# Patient Record
Sex: Female | Born: 1935 | Race: White | Hispanic: No
Health system: Southern US, Community
[De-identification: ages and names within clinical notes are randomized; demographics above are authoritative.]

## PROBLEM LIST (undated history)

## (undated) DIAGNOSIS — B019 Varicella without complication: Secondary | ICD-10-CM

## (undated) DIAGNOSIS — E785 Hyperlipidemia, unspecified: Secondary | ICD-10-CM

## (undated) DIAGNOSIS — N390 Urinary tract infection, site not specified: Secondary | ICD-10-CM

## (undated) DIAGNOSIS — J189 Pneumonia, unspecified organism: Secondary | ICD-10-CM

## (undated) DIAGNOSIS — I509 Heart failure, unspecified: Secondary | ICD-10-CM

## (undated) DIAGNOSIS — K219 Gastro-esophageal reflux disease without esophagitis: Secondary | ICD-10-CM

## (undated) DIAGNOSIS — R011 Cardiac murmur, unspecified: Secondary | ICD-10-CM

## (undated) DIAGNOSIS — M1990 Unspecified osteoarthritis, unspecified site: Secondary | ICD-10-CM

## (undated) DIAGNOSIS — E860 Dehydration: Secondary | ICD-10-CM

## (undated) HISTORY — DX: Unspecified osteoarthritis, unspecified site: M19.90

## (undated) HISTORY — DX: Pneumonia, unspecified organism: J18.9

## (undated) HISTORY — DX: Heart failure, unspecified: I50.9

## (undated) HISTORY — PX: ABDOMINAL HYSTERECTOMY: SHX81

## (undated) HISTORY — DX: Gastro-esophageal reflux disease without esophagitis: K21.9

## (undated) HISTORY — PX: APPENDECTOMY: SHX54

## (undated) HISTORY — PX: OTHER SURGICAL HISTORY: SHX169

## (undated) HISTORY — PX: BREAST BIOPSY: SHX20

## (undated) HISTORY — PX: TONSILLECTOMY AND ADENOIDECTOMY: SHX28

## (undated) HISTORY — DX: Varicella without complication: B01.9

## (undated) HISTORY — DX: Hyperlipidemia, unspecified: E78.5

## (undated) HISTORY — PX: SHOULDER ARTHROSCOPY: SHX128

## (undated) HISTORY — DX: Cardiac murmur, unspecified: R01.1

---

## 1979-07-11 HISTORY — PX: CHOLECYSTECTOMY: SHX55

## 2000-02-11 ENCOUNTER — Encounter: Admission: RE | Admit: 2000-02-11 | Discharge: 2000-02-11 | Payer: Self-pay | Admitting: Obstetrics and Gynecology

## 2000-02-11 ENCOUNTER — Encounter: Payer: Self-pay | Admitting: Obstetrics and Gynecology

## 2001-02-15 ENCOUNTER — Encounter: Payer: Self-pay | Admitting: Obstetrics and Gynecology

## 2001-02-15 ENCOUNTER — Encounter: Admission: RE | Admit: 2001-02-15 | Discharge: 2001-02-15 | Payer: Self-pay | Admitting: Obstetrics and Gynecology

## 2002-02-15 ENCOUNTER — Encounter: Payer: Self-pay | Admitting: Obstetrics and Gynecology

## 2002-02-15 ENCOUNTER — Encounter: Admission: RE | Admit: 2002-02-15 | Discharge: 2002-02-15 | Payer: Self-pay | Admitting: Obstetrics and Gynecology

## 2002-03-29 ENCOUNTER — Encounter (INDEPENDENT_AMBULATORY_CARE_PROVIDER_SITE_OTHER): Payer: Self-pay | Admitting: Specialist

## 2002-03-29 ENCOUNTER — Ambulatory Visit (HOSPITAL_COMMUNITY): Admission: RE | Admit: 2002-03-29 | Discharge: 2002-03-29 | Payer: Self-pay | Admitting: Gastroenterology

## 2002-11-09 DIAGNOSIS — J189 Pneumonia, unspecified organism: Secondary | ICD-10-CM

## 2002-11-09 HISTORY — DX: Pneumonia, unspecified organism: J18.9

## 2003-01-24 ENCOUNTER — Inpatient Hospital Stay (HOSPITAL_COMMUNITY): Admission: EM | Admit: 2003-01-24 | Discharge: 2003-01-29 | Payer: Self-pay

## 2003-01-26 ENCOUNTER — Encounter: Payer: Self-pay | Admitting: Internal Medicine

## 2003-01-27 ENCOUNTER — Encounter: Payer: Self-pay | Admitting: Internal Medicine

## 2003-02-28 ENCOUNTER — Encounter: Admission: RE | Admit: 2003-02-28 | Discharge: 2003-02-28 | Payer: Self-pay | Admitting: Obstetrics and Gynecology

## 2003-02-28 ENCOUNTER — Encounter: Payer: Self-pay | Admitting: Obstetrics and Gynecology

## 2003-08-16 ENCOUNTER — Encounter: Payer: Self-pay | Admitting: Obstetrics and Gynecology

## 2003-08-16 ENCOUNTER — Encounter: Admission: RE | Admit: 2003-08-16 | Discharge: 2003-08-16 | Payer: Self-pay | Admitting: Obstetrics and Gynecology

## 2004-02-29 ENCOUNTER — Encounter: Admission: RE | Admit: 2004-02-29 | Discharge: 2004-02-29 | Payer: Self-pay | Admitting: Obstetrics and Gynecology

## 2005-03-02 ENCOUNTER — Encounter: Admission: RE | Admit: 2005-03-02 | Discharge: 2005-03-02 | Payer: Self-pay | Admitting: Internal Medicine

## 2006-03-04 ENCOUNTER — Encounter: Admission: RE | Admit: 2006-03-04 | Discharge: 2006-03-04 | Payer: Self-pay | Admitting: Obstetrics and Gynecology

## 2006-07-05 ENCOUNTER — Encounter: Admission: RE | Admit: 2006-07-05 | Discharge: 2006-07-05 | Payer: Self-pay | Admitting: Internal Medicine

## 2007-03-08 ENCOUNTER — Encounter: Admission: RE | Admit: 2007-03-08 | Discharge: 2007-03-08 | Payer: Self-pay | Admitting: Obstetrics and Gynecology

## 2007-03-10 ENCOUNTER — Encounter: Admission: RE | Admit: 2007-03-10 | Discharge: 2007-03-10 | Payer: Self-pay | Admitting: Obstetrics and Gynecology

## 2008-03-09 ENCOUNTER — Encounter: Admission: RE | Admit: 2008-03-09 | Discharge: 2008-03-09 | Payer: Self-pay | Admitting: Obstetrics and Gynecology

## 2008-09-04 ENCOUNTER — Encounter: Admission: RE | Admit: 2008-09-04 | Discharge: 2008-09-04 | Payer: Self-pay | Admitting: *Deleted

## 2008-12-13 ENCOUNTER — Encounter: Admission: RE | Admit: 2008-12-13 | Discharge: 2008-12-13 | Payer: Self-pay | Admitting: *Deleted

## 2009-02-16 ENCOUNTER — Encounter: Admission: RE | Admit: 2009-02-16 | Discharge: 2009-02-16 | Payer: Self-pay | Admitting: Orthopedic Surgery

## 2009-04-11 ENCOUNTER — Encounter: Admission: RE | Admit: 2009-04-11 | Discharge: 2009-04-11 | Payer: Self-pay | Admitting: Obstetrics and Gynecology

## 2009-11-13 ENCOUNTER — Encounter: Admission: RE | Admit: 2009-11-13 | Discharge: 2009-11-13 | Payer: Self-pay | Admitting: Internal Medicine

## 2010-02-18 ENCOUNTER — Ambulatory Visit (HOSPITAL_COMMUNITY): Admission: RE | Admit: 2010-02-18 | Discharge: 2010-02-18 | Payer: Self-pay | Admitting: Orthopedic Surgery

## 2010-03-18 ENCOUNTER — Encounter (INDEPENDENT_AMBULATORY_CARE_PROVIDER_SITE_OTHER): Payer: Self-pay | Admitting: Cardiology

## 2010-03-18 ENCOUNTER — Ambulatory Visit (HOSPITAL_COMMUNITY): Admission: RE | Admit: 2010-03-18 | Discharge: 2010-03-18 | Payer: Self-pay | Admitting: Cardiology

## 2010-03-31 ENCOUNTER — Ambulatory Visit: Payer: Self-pay | Admitting: Thoracic Surgery (Cardiothoracic Vascular Surgery)

## 2010-04-24 ENCOUNTER — Ambulatory Visit (HOSPITAL_COMMUNITY): Admission: RE | Admit: 2010-04-24 | Discharge: 2010-04-25 | Payer: Self-pay | Admitting: Orthopedic Surgery

## 2010-07-22 IMAGING — CR DG WRIST 2V*R*
2 series · 2 of 2 positions shown · non-contrast
Comparison: None

CLINICAL DATA: Ulnar sided right wrist swelling, no known injury

RIGHT WRIST - 2 VIEW

[view not recorded (1 of 2)]
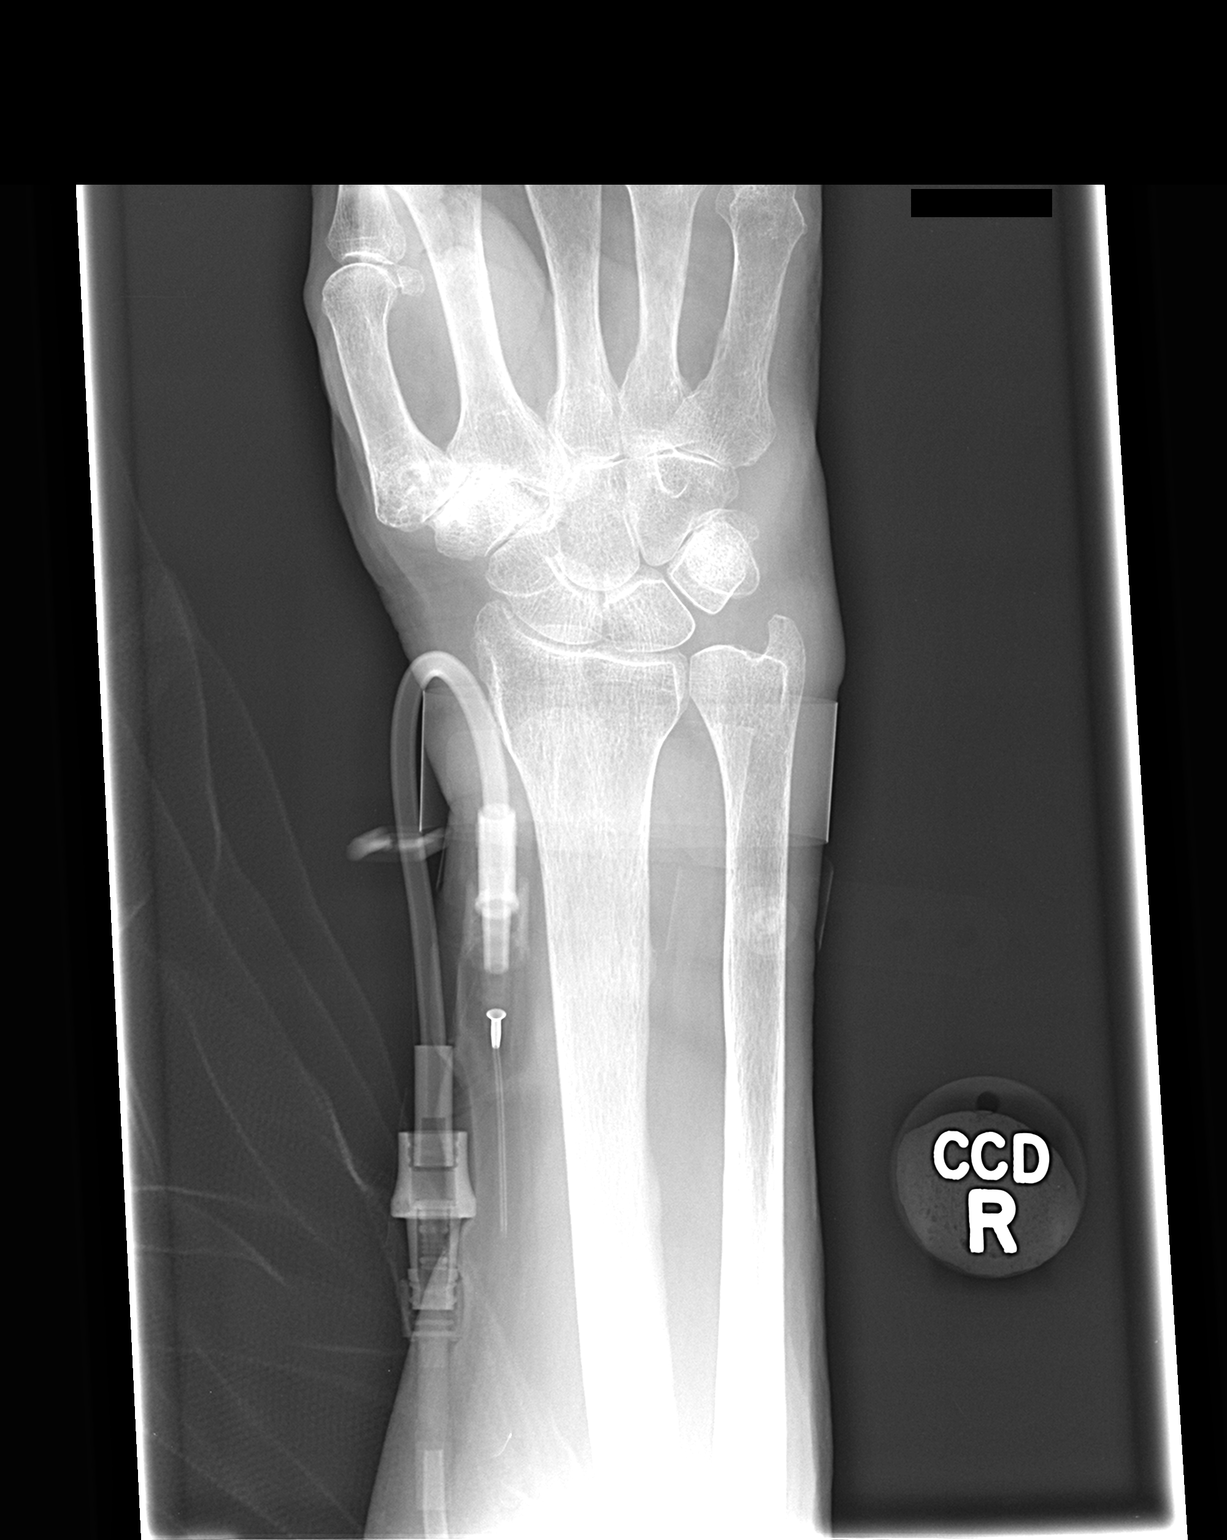

[view not recorded (2 of 2)]
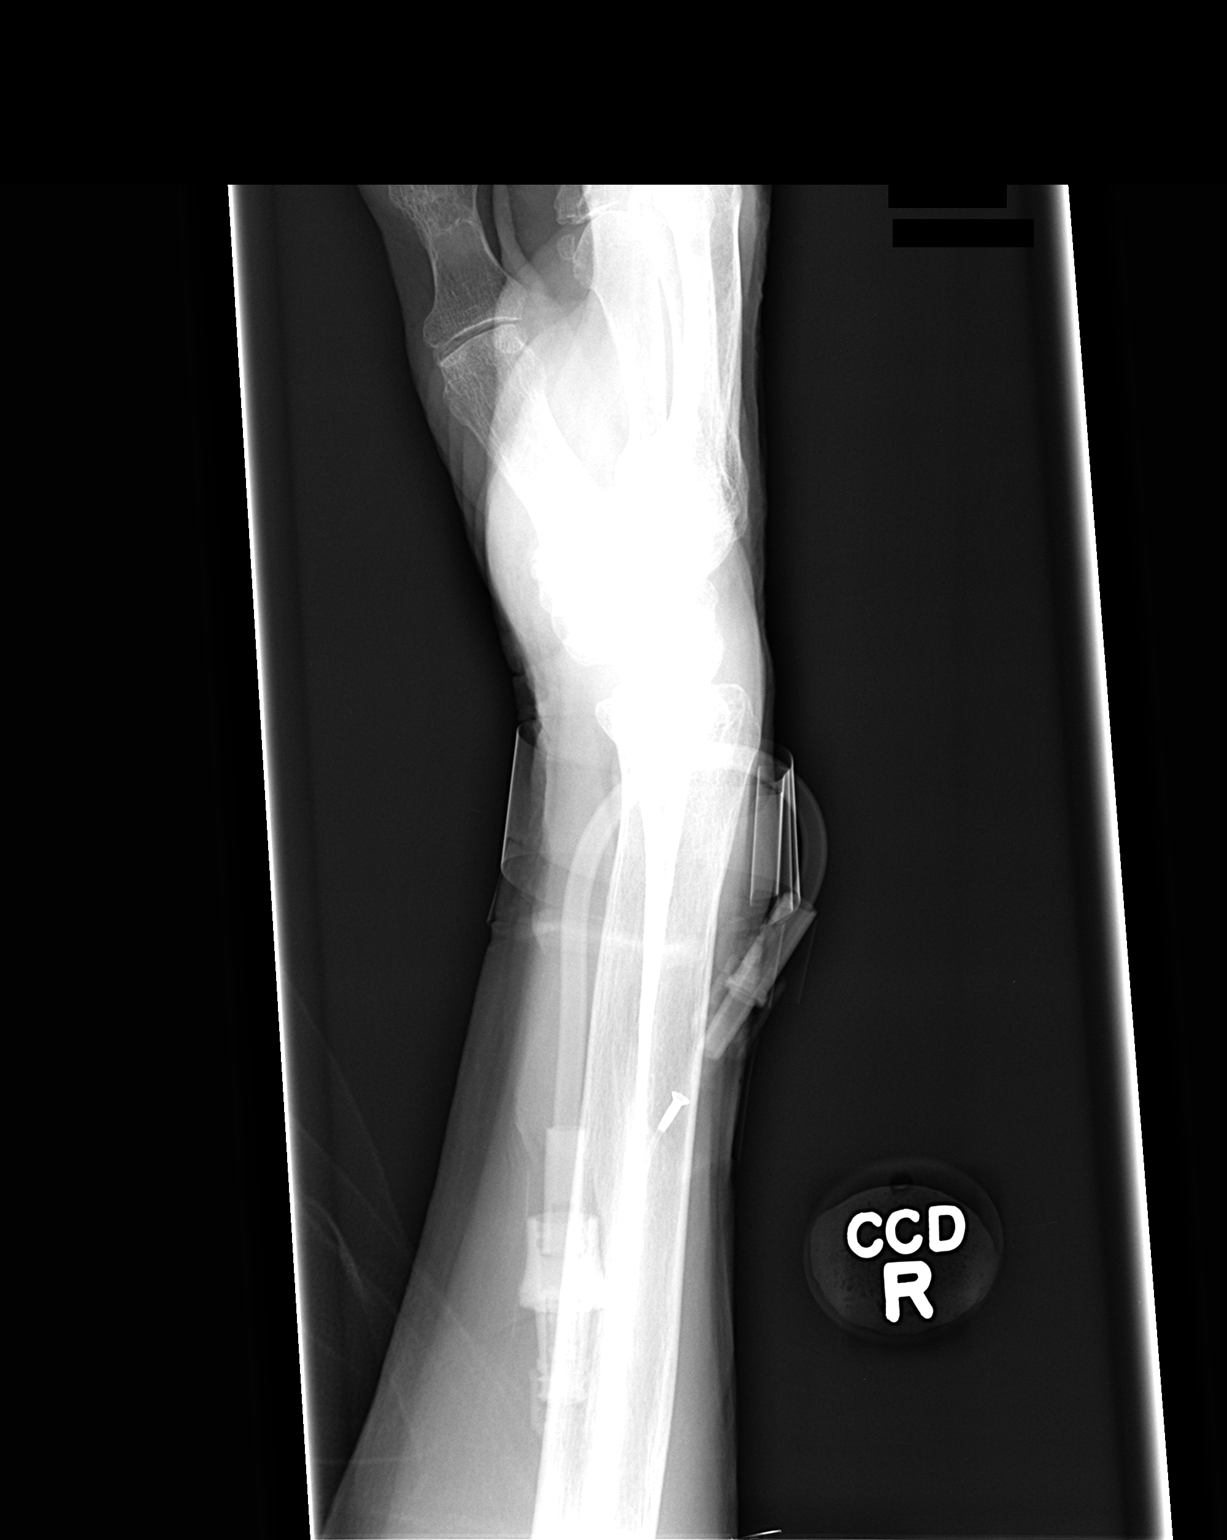

[2 of 2 positions shown; findings below may reference images not displayed]

FINDINGS: IV catheter and artifacts at radial border of wrist.
Additional artifacts from name band.
Diffuse bony demineralization.
Degenerative changes right first CMC joint.
Joint spaces otherwise grossly preserved.
No definite acute fracture or dislocation.
Mild soft tissue swelling at ulnar border of carpus.
IMPRESSION: Bony demineralization.
Soft tissue swelling without acute bony abnormality.

## 2010-07-29 ENCOUNTER — Encounter: Admission: RE | Admit: 2010-07-29 | Discharge: 2010-07-29 | Payer: Self-pay | Admitting: Obstetrics and Gynecology

## 2010-09-15 ENCOUNTER — Encounter: Admission: RE | Admit: 2010-09-15 | Discharge: 2010-09-15 | Payer: Self-pay | Admitting: Internal Medicine

## 2010-10-20 ENCOUNTER — Encounter
Admission: RE | Admit: 2010-10-20 | Discharge: 2010-10-20 | Payer: Self-pay | Source: Home / Self Care | Attending: Internal Medicine | Admitting: Internal Medicine

## 2010-10-30 ENCOUNTER — Inpatient Hospital Stay (HOSPITAL_COMMUNITY)
Admission: EM | Admit: 2010-10-30 | Discharge: 2010-11-05 | Payer: Self-pay | Source: Home / Self Care | Attending: Internal Medicine | Admitting: Internal Medicine

## 2010-10-31 ENCOUNTER — Encounter (INDEPENDENT_AMBULATORY_CARE_PROVIDER_SITE_OTHER): Payer: Self-pay | Admitting: Internal Medicine

## 2011-01-19 LAB — BASIC METABOLIC PANEL
BUN: 6 mg/dL (ref 6–23)
BUN: 7 mg/dL (ref 6–23)
BUN: 9 mg/dL (ref 6–23)
CO2: 20 mEq/L (ref 19–32)
CO2: 32 mEq/L (ref 19–32)
Calcium: 8.2 mg/dL — ABNORMAL LOW (ref 8.4–10.5)
Calcium: 8.7 mg/dL (ref 8.4–10.5)
Chloride: 89 mEq/L — ABNORMAL LOW (ref 96–112)
Chloride: 91 mEq/L — ABNORMAL LOW (ref 96–112)
Chloride: 96 mEq/L (ref 96–112)
Creatinine, Ser: 1.02 mg/dL (ref 0.4–1.2)
GFR calc Af Amer: >60 mL/min (ref >60)
GFR calc Af Amer: >60 mL/min (ref >60)
GFR calc Af Amer: >60 mL/min (ref >60)
GFR calc non Af Amer: 52 mL/min — ABNORMAL LOW (ref >60)
GFR calc non Af Amer: >60 mL/min (ref >60)
GFR calc non Af Amer: >60 mL/min (ref >60)
Glucose, Bld: 108 mg/dL — ABNORMAL HIGH (ref 70–99)
Glucose, Bld: 89 mg/dL (ref 70–99)
Potassium: 3 mEq/L — ABNORMAL LOW (ref 3.5–5.1)
Potassium: 3.5 mEq/L (ref 3.5–5.1)
Potassium: 3.7 mEq/L (ref 3.5–5.1)
Potassium: 4.2 mEq/L (ref 3.5–5.1)
Sodium: 126 mEq/L — ABNORMAL LOW (ref 135–145)
Sodium: 130 mEq/L — ABNORMAL LOW (ref 135–145)
Sodium: 132 mEq/L — ABNORMAL LOW (ref 135–145)
Sodium: 133 mEq/L — ABNORMAL LOW (ref 135–145)

## 2011-01-19 LAB — URINALYSIS, ROUTINE W REFLEX MICROSCOPIC
Glucose, UA: NEGATIVE mg/dL
Hgb urine dipstick: NEGATIVE
Specific Gravity, Urine: 1.015 (ref 1.005–1.030)
pH: 7 (ref 5.0–8.0)

## 2011-01-19 LAB — GLUCOSE, CAPILLARY
Glucose-Capillary: 106 mg/dL — ABNORMAL HIGH (ref 70–99)
Glucose-Capillary: 107 mg/dL — ABNORMAL HIGH (ref 70–99)
Glucose-Capillary: 107 mg/dL — ABNORMAL HIGH (ref 70–99)
Glucose-Capillary: 114 mg/dL — ABNORMAL HIGH (ref 70–99)
Glucose-Capillary: 116 mg/dL — ABNORMAL HIGH (ref 70–99)
Glucose-Capillary: 117 mg/dL — ABNORMAL HIGH (ref 70–99)
Glucose-Capillary: 125 mg/dL — ABNORMAL HIGH (ref 70–99)
Glucose-Capillary: 127 mg/dL — ABNORMAL HIGH (ref 70–99)
Glucose-Capillary: 134 mg/dL — ABNORMAL HIGH (ref 70–99)
Glucose-Capillary: 135 mg/dL — ABNORMAL HIGH (ref 70–99)
Glucose-Capillary: 97 mg/dL (ref 70–99)

## 2011-01-19 LAB — COMPREHENSIVE METABOLIC PANEL
AST: 25 U/L (ref 0–37)
Albumin: 2.8 g/dL — ABNORMAL LOW (ref 3.5–5.2)
Alkaline Phosphatase: 127 U/L — ABNORMAL HIGH (ref 39–117)
BUN: 6 mg/dL (ref 6–23)
Chloride: 88 mEq/L — ABNORMAL LOW (ref 96–112)
Potassium: 3.5 mEq/L (ref 3.5–5.1)
Total Bilirubin: 1.6 mg/dL — ABNORMAL HIGH (ref 0.3–1.2)

## 2011-01-19 LAB — BLOOD GAS, ARTERIAL
Acid-base deficit: 1.4 mmol/L (ref 0.0–2.0)
Bicarbonate: 22.5 mEq/L (ref 20.0–24.0)
O2 Saturation: 96.8 %
Patient temperature: 98.6
TCO2: 23.6 mmol/L (ref 0–100)

## 2011-01-19 LAB — CARDIAC PANEL(CRET KIN+CKTOT+MB+TROPI)
CK, MB: 2.2 ng/mL (ref 0.3–4.0)
CK, MB: 3.1 ng/mL (ref 0.3–4.0)
Relative Index: INVALID (ref 0.0–2.5)
Relative Index: INVALID (ref 0.0–2.5)
Total CK: 56 U/L (ref 7–177)
Troponin I: 0.17 ng/mL — ABNORMAL HIGH (ref 0.00–0.06)

## 2011-01-19 LAB — RAPID URINE DRUG SCREEN, HOSP PERFORMED
Benzodiazepines: NOT DETECTED
Cocaine: NOT DETECTED
Tetrahydrocannabinol: NOT DETECTED

## 2011-01-19 LAB — CBC
HCT: 32.3 % — ABNORMAL LOW (ref 36.0–46.0)
HCT: 35.6 % — ABNORMAL LOW (ref 36.0–46.0)
Hemoglobin: 10.8 g/dL — ABNORMAL LOW (ref 12.0–15.0)
Hemoglobin: 11.7 g/dL — ABNORMAL LOW (ref 12.0–15.0)
MCHC: 33.4 g/dL (ref 30.0–36.0)
MCHC: 34.8 g/dL (ref 30.0–36.0)
MCV: 84.1 fL (ref 78.0–100.0)
MCV: 86.8 fL (ref 78.0–100.0)
Platelets: 322 10*3/uL (ref 150–400)
Platelets: 394 10*3/uL (ref 150–400)
RBC: 4.1 MIL/uL (ref 3.87–5.11)
RBC: 4.29 MIL/uL (ref 3.87–5.11)
RDW: 14.5 % (ref 11.5–15.5)
RDW: 14.6 % (ref 11.5–15.5)
RDW: 15 % (ref 11.5–15.5)
RDW: 15.1 % (ref 11.5–15.5)
WBC: 10.7 10*3/uL — ABNORMAL HIGH (ref 4.0–10.5)
WBC: 11.4 10*3/uL — ABNORMAL HIGH (ref 4.0–10.5)
WBC: 14.1 10*3/uL — ABNORMAL HIGH (ref 4.0–10.5)
WBC: 15.9 10*3/uL — ABNORMAL HIGH (ref 4.0–10.5)

## 2011-01-19 LAB — DIFFERENTIAL
Basophils Absolute: 0 10*3/uL (ref 0.0–0.1)
Basophils Relative: 0 % (ref 0–1)
Eosinophils Absolute: 0 10*3/uL (ref 0.0–0.7)
Eosinophils Relative: 0 % (ref 0–5)
Monocytes Absolute: 1.1 10*3/uL — ABNORMAL HIGH (ref 0.1–1.0)
Neutro Abs: 11.7 10*3/uL — ABNORMAL HIGH (ref 1.7–7.7)

## 2011-01-19 LAB — MRSA PCR SCREENING: MRSA by PCR: NEGATIVE

## 2011-01-19 LAB — BRAIN NATRIURETIC PEPTIDE
Pro B Natriuretic peptide (BNP): 564 pg/mL — ABNORMAL HIGH (ref 0.0–100.0)
Pro B Natriuretic peptide (BNP): >3200 pg/mL — ABNORMAL HIGH (ref 0.0–100.0)

## 2011-01-19 LAB — CREATININE, URINE, RANDOM: Creatinine, Urine: 49.1 mg/dL

## 2011-01-19 LAB — URINE MICROSCOPIC-ADD ON

## 2011-01-19 LAB — URINE CULTURE

## 2011-01-26 LAB — BASIC METABOLIC PANEL WITH GFR
BUN: 8 mg/dL (ref 6–23)
CO2: 29 meq/L (ref 19–32)
Calcium: 9.7 mg/dL (ref 8.4–10.5)
Chloride: 100 meq/L (ref 96–112)
Creatinine, Ser: 0.78 mg/dL (ref 0.4–1.2)
GFR calc non Af Amer: >60 mL/min
Glucose, Bld: 99 mg/dL (ref 70–99)
Potassium: 4.8 meq/L (ref 3.5–5.1)
Sodium: 134 meq/L — ABNORMAL LOW (ref 135–145)

## 2011-01-26 LAB — CBC
HCT: 41 % (ref 36.0–46.0)
Hemoglobin: 13.8 g/dL (ref 12.0–15.0)
MCHC: 33.6 g/dL (ref 30.0–36.0)
MCV: 92.2 fL (ref 78.0–100.0)
Platelets: 222 10*3/uL (ref 150–400)
RBC: 4.45 MIL/uL (ref 3.87–5.11)
RDW: 13.8 % (ref 11.5–15.5)
WBC: 8.2 10*3/uL (ref 4.0–10.5)

## 2011-01-26 LAB — SURGICAL PCR SCREEN
MRSA, PCR: NEGATIVE
Staphylococcus aureus: NEGATIVE

## 2011-01-27 IMAGING — CT CT HEAD W/O CM
1 series · 16 of 30 positions shown, 20 images · non-contrast
Comparison: None.

CLINICAL DATA: Altered mental status.  Not feeling well.

CT HEAD WITHOUT CONTRAST
TECHNIQUE: Contiguous axial images were obtained from the base of
the skull through the vertex without contrast.

[Series 2: fast head · axial · 0.49mm/px · z∈[+72,+219]mm · 16 of 48 slices shown, 20 images]
[im 2/48  brain]
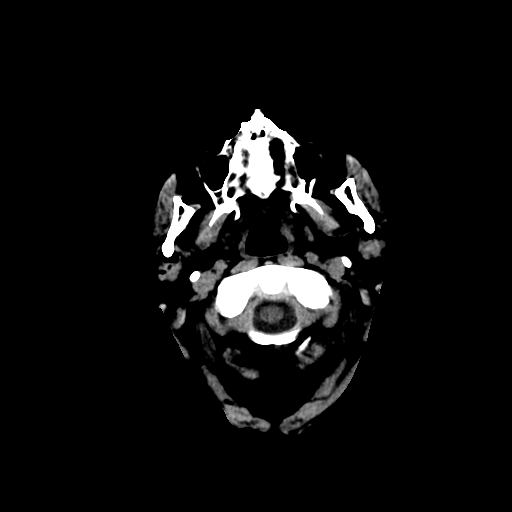
[im 2/48  bone]
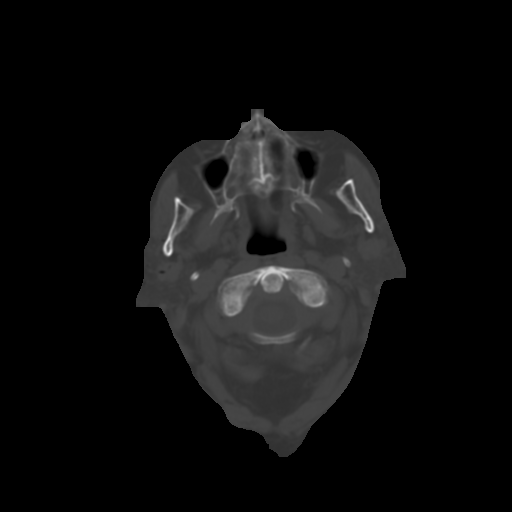
[im 5/48  brain]
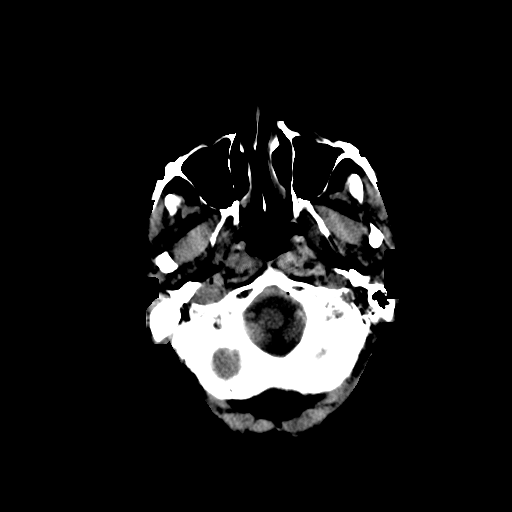
[im 9/48  brain]
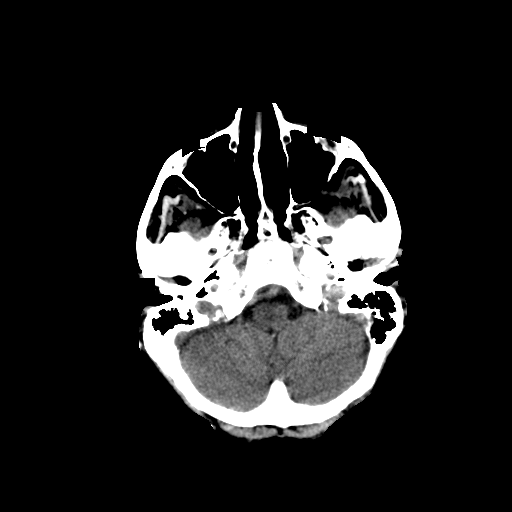
[im 12/48  brain]
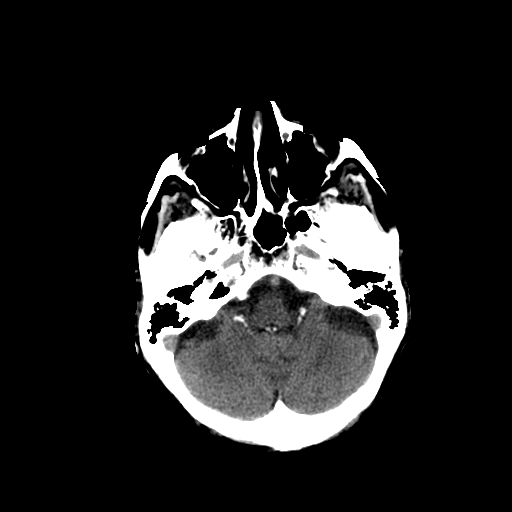
[im 13/48  brain]
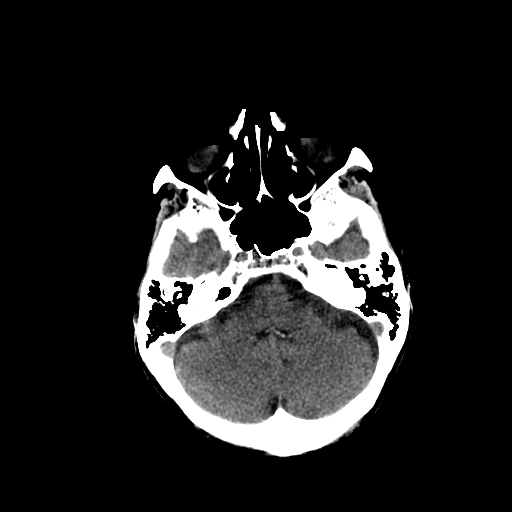
[im 13/48  bone]
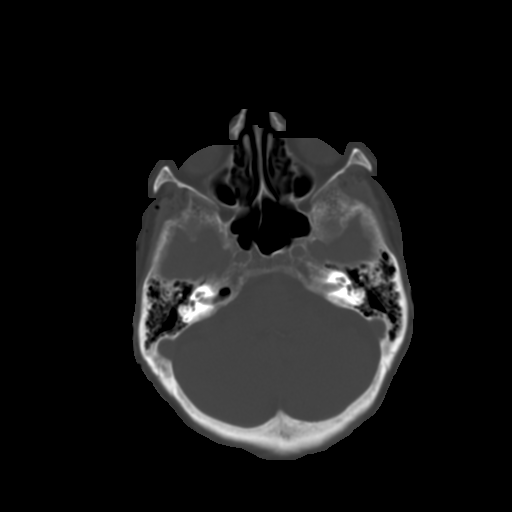
[im 17/48  brain]
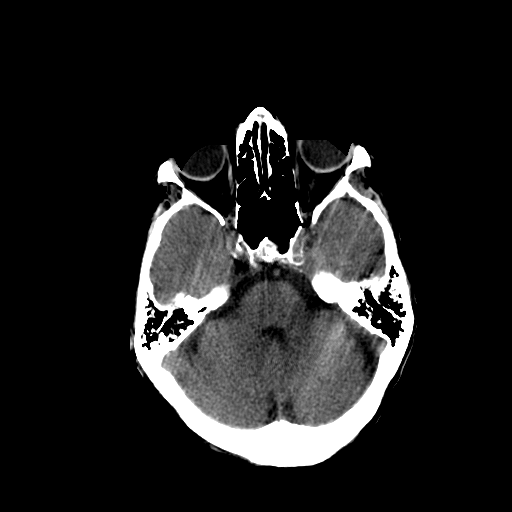
[im 20/48  brain]
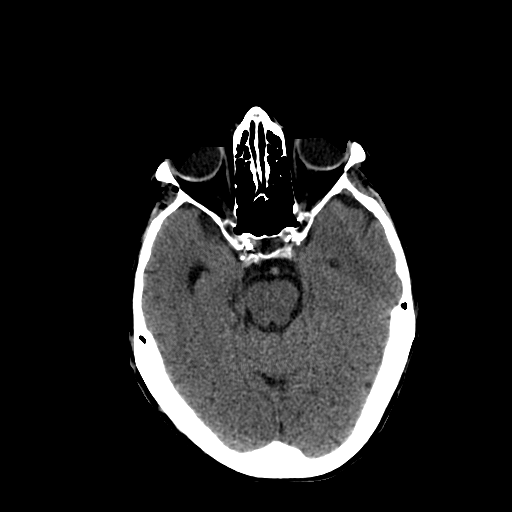
[im 23/48  brain]
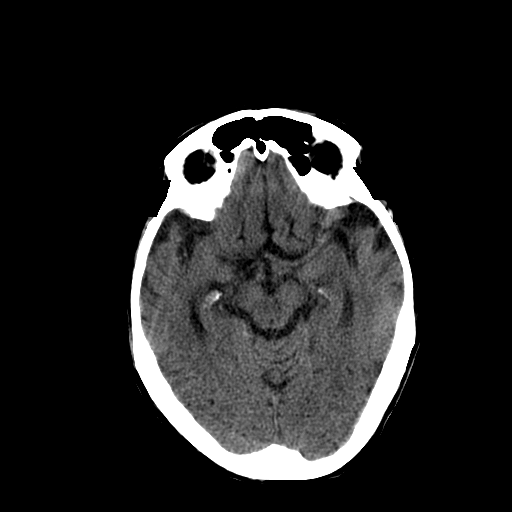
[im 25/48  brain]
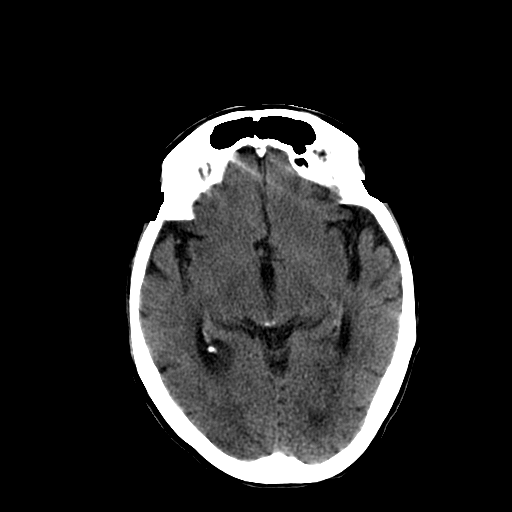
[im 25/48  bone]
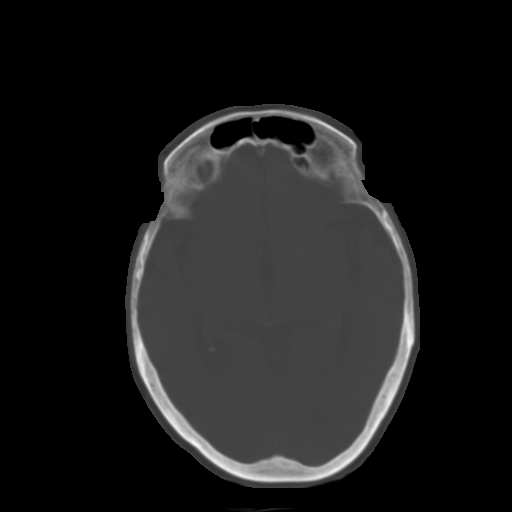
[im 28/48  brain]
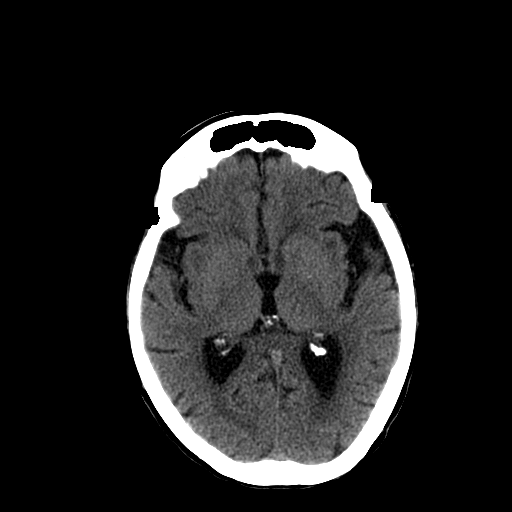
[im 31/48  brain]
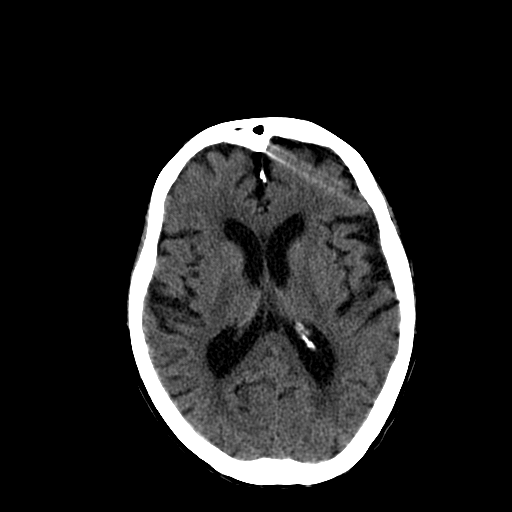
[im 35/48  brain]
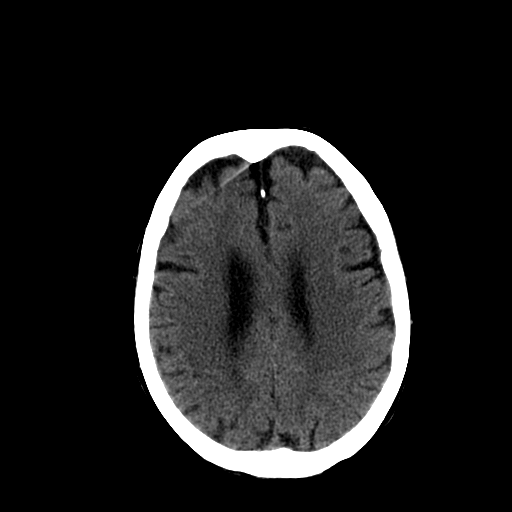
[im 36/48  brain]
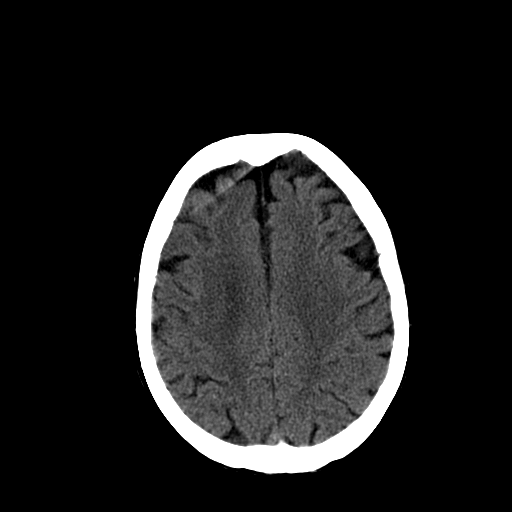
[im 36/48  bone]
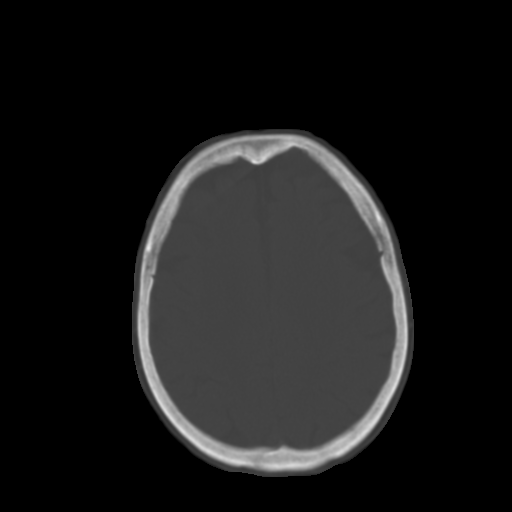
[im 39/48  brain]
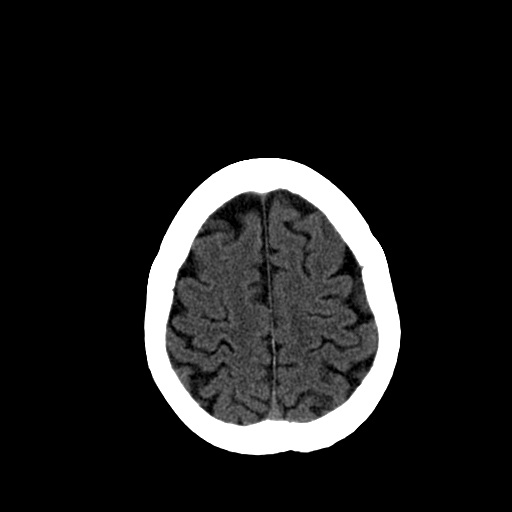
[im 43/48  brain]
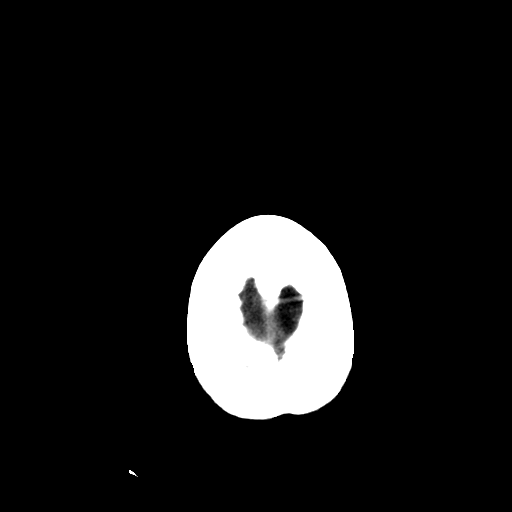
[im 46/48  brain]
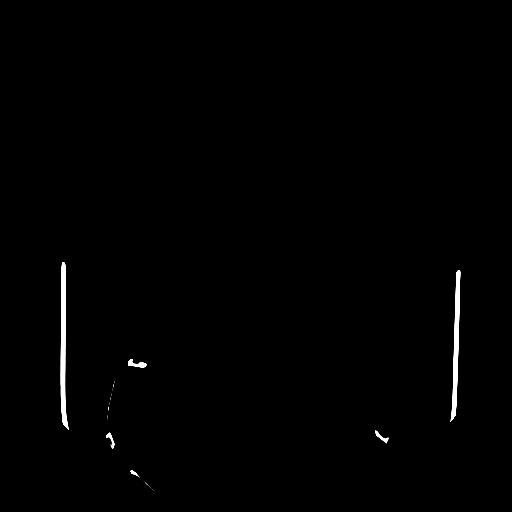

[16 of 30 positions shown; findings below may reference images not displayed]

FINDINGS: There is no evidence for acute infarction, intracranial
hemorrhage, mass lesion, hydrocephalus, or extra-axial fluid.
Moderate to severe atrophy is present.  Advanced chronic
microvascular ischemic change is present in the periventricular and
subcortical white matter.

Calvarium intact.  Sinuses and mastoids are clear.

Multiple air bubbles are noted in the extracranial soft tissues on
the right.  I suspect this relates to air in the patient's IV which
has refluxed into the jugular system.  Correlate clinically for any
possible laceration or other source of air embolus.
IMPRESSION: Atrophy and small vessel disease.  No acute intracranial findings.

Multiple small bubbles of air are seen in the extracranial soft
tissues, on the right.  See comments above.

## 2011-01-28 LAB — CBC
MCHC: 34.2 g/dL (ref 30.0–36.0)
Platelets: 232 10*3/uL (ref 150–400)
RDW: 13.4 % (ref 11.5–15.5)

## 2011-03-24 ENCOUNTER — Ambulatory Visit (INDEPENDENT_AMBULATORY_CARE_PROVIDER_SITE_OTHER): Payer: Medicare Other | Admitting: Internal Medicine

## 2011-03-24 DIAGNOSIS — I509 Heart failure, unspecified: Secondary | ICD-10-CM

## 2011-03-24 DIAGNOSIS — R011 Cardiac murmur, unspecified: Secondary | ICD-10-CM

## 2011-03-24 DIAGNOSIS — K219 Gastro-esophageal reflux disease without esophagitis: Secondary | ICD-10-CM

## 2011-03-24 DIAGNOSIS — M1990 Unspecified osteoarthritis, unspecified site: Secondary | ICD-10-CM

## 2011-03-24 DIAGNOSIS — E785 Hyperlipidemia, unspecified: Secondary | ICD-10-CM

## 2011-03-24 DIAGNOSIS — M129 Arthropathy, unspecified: Secondary | ICD-10-CM

## 2011-03-24 DIAGNOSIS — N3090 Cystitis, unspecified without hematuria: Secondary | ICD-10-CM

## 2011-03-24 NOTE — Progress Notes (Signed)
  Subjective:    Patient ID: Diane [name], female    DOB: September 23, 1924, 77 y.o.   MRN: [id number]  HPI Diane [name] presents to establish for on-going continuity care. She was formerly seen by Diane [name], [facility]. Her PCP for 30 years retired. The patient has a h/o CHF with several hospitalizations and was just discharged from [facility] as being to healthy with too good a prognosis to continue. She has been feeling well. She does suffer from loss of hearing. She has dementia and a personality disorder. She has a long list of drugs to which she thinks she is allergic when in reality she may not have true allergy.                  Review of Systems Review of Systems  Constitutional:  Negative for fever, chills, activity change and unexpected weight change.  HENT:  Negative for hearing loss, ear pain, congestion, neck stiffness and postnasal drip.   Eyes: Negative for pain, discharge and visual disturbance.  Respiratory: Negative for chest tightness and wheezing.   Cardiovascular: Negative for chest pain and palpitations.       [No decreased exercise tolerance Gastrointestinal: [No change in bowel habit. No bloating or gas. No reflux or indigestion Genitourinary: Negative for urgency, frequency, flank pain and difficulty urinating.  Musculoskeletal: Negative for myalgias, back pain. Positive for arthralgias hands, shoulder. .  Neurological: Negative for dizziness, tremors, weakness and headaches.  Hematological: Negative for adenopathy.  Psychiatric/Behavioral: Negative for behavioral problems and dysphoric mood.       Objective:   Physical Exam Vitals stable Constititutional - WNWD white woman in no distress HEENT-= grossly normal Pul - no increased work of breathing, no rales or wheezes Cor - RRR, III/VI systolic murmur RSB, III/VI holosystolic apex and axillary line Ext - no edema       Assessment & Plan:  1. CHF - compensated with no evidence of SOB or  increased work of breathing at today's visit.  Plan - per Dr. Allyson [name]           Continue present meds.  2. GERD - stable on aciphex but needs generic substitution or otc prilosec due to cost.  3. Chronic cystitis - on macrodantin. Will need to substitute another antibiotic for suppression  4. Psych - question of mild personality disorder/dementia. Family is very attentive.

## 2011-03-24 NOTE — H&P (Signed)
HISTORY AND PHYSICAL EXAMINATION   Mar 31, 2010   Re:  Diane [name], Diane [name]          DOB:  1924/01/21   PRIMARY CARE PHYSICIAN:  Maxwell Caul, MD   REASON FOR CONSULTATION:  Severe mitral regurgitation.   HISTORY OF PRESENT ILLNESS:  The patient is an 77 year old old widowed  white female from [place] with long-standing history of mitral valve  prolapse and mitral regurgitation.  The patient states that she was  first diagnosed with premature ventricular contractions in 1981.  She  has been taking beta-blocker ever since.  She believes that it was  perhaps 10-15 years ago when she was first diagnosed with a heart  murmur, and ultimately this was diagnosed as mitral valve prolapse and  mitral regurgitation once a formal echocardiogram was performed.  She  has been treated medically ever since.  She states that she has a long  history of stable exertional shortness of breath.  These symptoms are  relatively mild according to the patient and her family.  She still  remains physically active for her age and functionally independent.  She  lives alone and still drives an automobile, does her own shopping, and  tends to basic chores around the house.  She denies any resting  shortness of breath, PND, orthopnea, or lower extremity edema.  She was  seen in followup by Dr. Allyson Sabal recently and a followup echocardiogram was  performed on March 05, 2010.  This demonstrated the presence of normal  left ventricular size and function with ejection fraction estimated 50%  to 55%.  There was moderate-to-severe left atrial enlargement with  moderate-to-severe mitral regurgitation and prolapse of the posterior  leaflet of the mitral valve.  There was moderate tricuspid regurgitation  with evidence suggestive of severe pulmonary hypertension.  There was  moderate aortic regurgitation as well.  The patient was subsequently  scheduled for a nuclear stress test and transesophageal  echocardiogram.  Transesophageal echocardiogram was performed by Dr. Lynnea Ferrier on Mar 18, 2010.  This confirmed the presence of severe mitral regurgitation with  flail segment of the posterior leaflet of the mitral valve.  There was  normal left ventricular systolic function with ejection fraction  estimated 60% to 65%.  There was reported severe tricuspid regurgitation  as well with evidence of significant pulmonary hypertension.  Stress  Cardiolite exam performed on Mar 24, 2010, was notable for some  decreased signal intensity in the inferior walls, but otherwise normal  exam with ejection fraction 66% and no EKG criteria for ischemia.  The  patient has been referred to consider surgical intervention.   REVIEW OF SYSTEMS:  GENERAL:  The patient reports that she feels well.  She has normal appetite and she has not been gaining nor losing weight.  She is 5 feet 4-1/2 inches tall and weighs 127 pounds.  CARDIAC:  Notable for what the patient describes as mild symptoms of  exertional shortness of breath and fatigue.  She denies resting  shortness of breath, PND, orthopnea.  She reports occasional vague  tightness across the chest that she attributes to medications she has  had in the past.  She has occasional mild dizzy spells.  She has never  had any syncopal episodes.  RESPIRATORY:  Negative.  The patient denies productive cough,  hemoptysis, wheezing.  GASTROINTESTINAL:  Notable only for occasional symptoms of mild reflux.  The patient has no difficulty swallowing.  She denies hematochezia,  hematemesis, melena.  MUSCULOSKELETAL:  Notable for mild arthralgias.  The patient was not  limited severely by this, and she ambulates well without using a walker  or cane.  GENITOURINARY:  Negative.  HEENT:  Negative.  The patient has full set of dentures.  She has no  recent changes in her eyesight.   PAST MEDICAL HISTORY:  1. Mitral valve prolapse with mitral regurgitation.  2. Premature  ventricular contractions.   FAMILY HISTORY:  Noncontributory.   SOCIAL HISTORY:  The patient is widowed and lives alone here in  [place].  She has 2 grown children, both of whom accompany her to the  office for consultation today and are very supportive.  The patient has  a remote history of tobacco use during her youth but she quit smoking at  age 18.  She does not use alcohol.   CURRENT MEDICATIONS:  1. Propranolol 20 mg 3 times daily.  2. AcipHex 20 mg daily.  3. Macrodantin 50 mg daily.  4. Meclizine 12.5 mg as needed for dizzy spells.  5. Multivitamin.  6. Viactiv 1 tablet daily.  7. Claritin as needed for allergies.  8. Peri-Colace for constipation.   DRUG ALLERGIES:  Intolerance to statins as well as history of  sensitivity to penicillin causes rash, erythromycin causes rash, sulfa  causes nausea, peridium causes nausea, Biaxin causes rash, Evista causes  chest discomfort.   PHYSICAL EXAMINATION:  Notable for a well-appearing elderly female who  appears her stated age or perhaps somewhat younger in no acute distress.  Blood pressure 148/84, pulse 72 and regular, oxygen saturation 96% on  room air.  HEENT exam is unrevealing.  The neck is supple.  There is no  cervical nor supraclavicular lymphadenopathy.  Auscultation of the chest  reveals clear breath sounds which are symmetrical bilaterally.  There is  mild kyphoscoliosis.  Cardiovascular exam is notable for regular rate  and rhythm.  There is a prominent grade 3-4/6 holosystolic murmur heard  best at the apex with radiation all across the precordium into the back  and axilla.  No diastolic murmurs are noted.  The abdomen is soft,  nondistended, nontender.  There is no ascites.  The liver edge is not  palpable.  The extremities are warm and well perfused.  There is trace  bilateral lower extremity edema.  Femoral pulses are palpable.  Distal  pulses are thready at the ankle.  There is no sign of significant venous   insufficiency.  Rectal and GU exams both deferred.  Neurologic  examination is grossly nonfocal and symmetrical throughout.   DIAGNOSTIC TESTS:  Transesophageal echocardiogram performed by Dr.  Lynnea Ferrier on Mar 18, 2010, is reviewed.  This confirmed the presence of a  flail segment of the posterior leaflet of the mitral valve with severe  (4+) mitral regurgitation.  The jet of regurgitation courses anteriorly  around the left atrium.  There is some calcification of the mitral  annulus.  The rest of the mitral valve appears to move fairly normally.  There is mild thickening of the leaflets, but overall the pathology is  consistent with fibroelastic deficiency type degenerative disease.  There is normal left ventricular systolic function.  The aortic valve is  tricuspid.  There is definitely mild aortic regurgitation.  At most it  maybe moderate but certainly not severe.  This does not seem to be  severe enough to warrant any sort of intervention.  I think there is  moderate (2+) tricuspid regurgitation.  There is left atrial  enlargement.  The right ventricle is not dilated.  No other significant  abnormalities are noted.   IMPRESSION:  Mitral valve prolapse with severe mitral regurgitation.  The patient has signs consistent with significant pulmonary hypertension  as well as at least moderate tricuspid regurgitation seen on 2 different  echocardiograms.  Despite this, the patient claims to remain relatively  asymptomatic with stable mild exertional shortness of breath.  She is  almost 77 years old.  She remains functionally independent and  relatively active for her age.  Although the risks of surgery will be  somewhat increased because of the patient's age, I think she is  certainly a reasonably good candidate for mitral valve repair.  Without  surgical intervention she will undoubtedly be facing the potential for  worsening problems with heart failure, likely development of atrial   fibrillation, and decreased longevity.  She does not have any other  significant medical problems that will shorten her life expectancy, and  following successful mitral valve repair she could do well.  Nonetheless, risks associated with surgery in a patient this age cannot  be underestimated, and any complications could result in loss of her  functional independence.   PLAN:  I have discussed options at length with the patient and her 2  children here in the office today.  Her son is very concerned about the  prospect of her having surgery.  The patient seems to be more interested  in surgery.  She is also concerned about her shoulder which she had been  scheduled to have shoulder surgery recently and these plans were put on  hold after her mitral regurgitation was discovered and reevaluated.  I  do not think that the potential need for rotator cuff repair should be  the primary determinant as to whether or not she considers to have  elective mitral valve surgery.  However, I do think she would probably  do fine with mitral valve repair, and without it she will ultimately  face significant problems with heart failure in the not too distant  future.  She and her family want to think matters over further before  making any sore final decision.  If she is interested in proceeding with  surgery, she would need to have heart catheterization performed.  If  this is the case, I would favor doing left heart catheterization either  via the left femoral approach or radial artery approach with aortography  to include the descending thoracic and abdominal aorta and iliac  vessels.  She would also need right heart catheterization to assess her  pulmonary artery pressures.  I will be delighted to see her back at any  time in the near future.  Mrs. Lenart does not wish to schedule another  follow up appointment at this time.  All of their questions have been  addressed.   Salvatore Decent. Cornelius Moras, M.D.   Electronically Signed   CHO/MEDQ  D:  03/31/2010  T:  04/01/2010  Job:  [id number]   cc:   Nanetta Batty, M.D.  Maxwell Caul, M.D.  Marlowe Kays, M.D.

## 2011-03-26 ENCOUNTER — Encounter: Payer: Self-pay | Admitting: Internal Medicine

## 2011-03-27 ENCOUNTER — Encounter: Payer: Self-pay | Admitting: Internal Medicine

## 2011-03-27 DIAGNOSIS — R011 Cardiac murmur, unspecified: Secondary | ICD-10-CM | POA: Insufficient documentation

## 2011-03-27 DIAGNOSIS — E785 Hyperlipidemia, unspecified: Secondary | ICD-10-CM | POA: Insufficient documentation

## 2011-03-27 DIAGNOSIS — I509 Heart failure, unspecified: Secondary | ICD-10-CM | POA: Insufficient documentation

## 2011-03-27 DIAGNOSIS — M1990 Unspecified osteoarthritis, unspecified site: Secondary | ICD-10-CM | POA: Insufficient documentation

## 2011-03-27 DIAGNOSIS — N3090 Cystitis, unspecified without hematuria: Secondary | ICD-10-CM | POA: Insufficient documentation

## 2011-03-27 DIAGNOSIS — K219 Gastro-esophageal reflux disease without esophagitis: Secondary | ICD-10-CM | POA: Insufficient documentation

## 2011-03-27 NOTE — Op Note (Signed)
. [facility]  Patient:    Diane [name], Diane [name] Visit Number: [id number] MRN: [id number]          Service Type: END Location: ENDO Attending Physician:  Charna Elizabeth Dictated by:   Anselmo Rod, M.D. Proc. Date: 03/29/02 Admit Date:  03/29/2002 Discharge Date: 03/29/2002   CC:         Hildred Laser, M.D.   Operative Report  DATE OF BIRTH:  07/16/1924.  REFERRING PHYSICIAN:  Hildred Laser, M.D.  PROCEDURE PERFORMED:  Colonoscopy with biopsies x 2.  ENDOSCOPIST:  Anselmo Rod, M.D.  INSTRUMENT USED:  Pediatric adjustable Olympus video colonoscope.  INDICATION FOR PROCEDURE:  A 77 year old white female with a history of rectal bleeding, rule out colonic polyps, masses, hemorrhoids, etc.  PREPROCEDURE PREPARATION:  Informed consent was procured from the patient. The patient was fasted for eight hours prior to the procedure and prepped with a bottle of magnesium citrate and a gallon of NuLytely the night prior to procedure.  PREPROCEDURE PHYSICAL:  VITAL SIGNS:  The patient had stable vital signs.  NECK:  Neck supple.  CHEST:  Clear to auscultation.  S1 and S2 regular.  ABDOMEN:  Soft with normal bowel sounds.  DESCRIPTION OF PROCEDURE:  The patient was placed in the left lateral decubitus position and sedated with 40 mg of Demerol and 5 mg of Versed intravenously.  Once the patient was adequately sedated and maintained on low-flow oxygen and continuous cardiac monitoring, the Olympus video colonoscope was advanced from the rectum to the cecum without difficulty.  A large cecal AVM was seen.  This was not bleeding and, therefore, was not ablated.  Two small polyps were biopsied from the rectum with the cold biopsy forceps.  Small internal hemorrhoids were appreciated on retroflexion in the rectum.  The procedure was completed up to the cecum and the appendiceal orifice and the ileocecal valve were clearly visualized and photographed.   No large masses or polyps were previously seen.  There were no evidence of diverticulosis.  IMPRESSION: 1. 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 2. Two small polyps biopsied from the rectum. 3. Small nonbleeding internal hemorrhoids.  RECOMMENDATIONS: 1. A high fiber diet has been recommended for the patient. 2. Avoid all nonsteroidals for the next two to three weeks. 3. Outpatient followup in the next two weeks. Dictated by:   Anselmo Rod, M.D. Attending Physician:  Charna Elizabeth DD:  03/29/02 TD:  03/30/02 Job: [id number]

## 2011-03-27 NOTE — Discharge Summary (Signed)
   NAMEJISELE, PRICE                            ACCOUNT NO.:  [id number]   MEDICAL RECORD NO.:  [id number]                   PATIENT TYPE:  INP   LOCATION:  0478                                 FACILITY:  [facility]   PHYSICIAN:  Olene Craven, M.D.            DATE OF BIRTH:  1923/11/12   DATE OF ADMISSION:  01/24/2003  DATE OF DISCHARGE:  01/29/2003                                 DISCHARGE SUMMARY   DISCHARGE DIAGNOSES:  1. Right lower lobe pneumonia.  2. Shortness of breath.  3. Hypokalemia.  4. Diplopia.   DISCHARGE MEDICATIONS:  1. Propranolol 20 mg p.o. t.i.d.  2. Zantac 150 mg q.h.s.  3. Aciphex 20 mg p.o. daily.  4. Nitrofurantoin 50 mg p.o. daily.  5. Prev-PAK double strength.   CONSULTS:  None.   PROCEDURE:  None.   HOSPITAL COURSE:  The patient was admitted on January 24, 2003 after failing  outpatient antibiotics for pneumonia.  Chest x-ray confirmed right lower  lobe pneumonia.  She was started on IV Rocephin and azithromycin.  During  the course of hospitalization she slowly improved.  She did report  developing double vision.  During the course of hospitalization MRI was  performed, which showed no significant findings.  Her diplopia did clear by  the time of discharge.  She did have low potassium which was replaced during  hospitalization.  She did receive a full course of antibiotics while she was  in the hospital.  She was discharged in stable condition with improvement in  her shortness of breath and respiratory status, improvement in the double  vision.  It would be recommended that she follow up with her ophthalmologist  as an outpatient for further evaluation of the double vision.  She is  discharged in stable condition.                                               Olene Craven, M.D.    DEH/MEDQ  D:  03/19/2003  T:  03/19/2003  Job:  [id number]

## 2011-03-27 NOTE — H&P (Signed)
Diane [name], Diane [name]                            ACCOUNT NO.:  [id number]   MEDICAL RECORD NO.:  [id number]                   PATIENT TYPE:  EMS   LOCATION:  ED                                   FACILITY:  [facility]   PHYSICIAN:  Ralene Ok, M.D.                   DATE OF BIRTH:  [birth year]   DATE OF ADMISSION:  01/24/2003  DATE OF DISCHARGE:                                HISTORY & PHYSICAL   PRESENTING PROBLEM:  Cough, shortness of breath, nausea and vomiting.   HISTORY OF PRESENT ILLNESS:  This 77 year old white female presented to the  emergency room with history of one week cough and shortness of breath. The  patient was seen yesterday by her physician and started on Ceftin but today,  developed nausea and vomiting and felt weak. The patient denies any fever.  She also denies any headache. No hemoptysis. Says that she is normally  ambulatory and had been feeling reasonably well prior to this episode.   PAST MEDICAL HISTORY:  Significant for premature ventricular contractions.  Does not give any definite history of coronary artery disease. Otherwise,  negative.   ALLERGIES:  The patient says that she has a long list of allergies and has  brought a list with her that includes PENICILLIN (rash), SULFA (nausea),  BIAXIN (rash), LEVAQUIN (muscle aches), MACROBID (chest pain and nausea),  CODEINE, VALIUM, ARISTA, MIACALCIN, LORCET, VIOXX, AND PREVACID. The patient  also says that she is unable to tolerate ZETIA AND LIPITOR. From her allergy  list, it appears that the patient is unable to tolerate medications for  osteoporosis or for hyperlipidemia.   CURRENT MEDICATIONS:  Includes Propranolol 20 mg by mouth three times a day,  Zantac and Aciphex. Nitrofurantoin 50 mg daily for chronic urinary tract  infections.   PHYSICAL EXAMINATION:  VITAL SIGNS: Afebrile.  GENERAL: She says that she feels unwell and keeps her eyes closed. The  patient does have her family around her.  HEENT:  Face is flushed with an erythematous rash on the face suggestive of  seborrheic dermatitis, although rosacea cannot be ruled out. Edentulous.  There is no adenopathy noted.  NECK: Supple.  CHEST: Showed crepitations mostly on the left side with scattered rhonchi.  Breath sounds diminished on the right side.  HEART: Rate was 88 per minute and regular. Blood pressure 180/90. On  admission her subsequent blood pressure recordings were normal.  ABDOMEN: Soft, nontender. Liver and spleen not palpable. There was no pedal  edema noted and no calf tenderness.  EXTREMITIES:  The patient admitted to some mild discomfort in her right  ankle which she says that she has sprained in the past but there is no  swelling or tenderness now.   LABORATORY DATA:  CBC is normal. 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 Potassium  was normal. Bicarb  was also normal. Glucose marginally elevated at 152. BUN  and creatinine were normal. Total protein and albumin were also normal.  Liver function studies's with exception of alkaline phosphatase slightly  elevated at 169 and normal.   DIAGNOSTIC IMPRESSION:  Chest x-ray confirmed right lower lobe pneumonia.   IMPRESSION:  1. Right lower lobe pneumonia with bronchospasm.  2. Hyponatremia, probably secondary to SIADH.  3. History of SVT on beta blocker which may be contributing to some of her     bronchospasm.  4. Gastroesophageal reflux disease, on Aciphex and Zantac.  5. Probable osteoporosis.  6. Probable hyperlipidemia.   PLAN:  The patient will be admitted to the hospital for IV antibiotics. Will  try her on IV Rocephin as she had been on Cefzil recently. Also add  Zithromax 500 mg by mouth every four hours as her reaction to the Biaxin was  suggestive but not clear of that allergy. Will add Solu-Medrol IV and  Albuterol nebulizer. Place the patient on 2 liters of nasal cannula O2 and   IV normal saline at 50 cc per hour. The patient will be admitted to Dr.  Dorinda Hill __ service, as he follows her in the outpatient setting.                                               Ralene Ok, M.D.    RM/MEDQ  D:  01/24/2003  T:  01/25/2003  Job:  [id number]

## 2011-04-07 ENCOUNTER — Encounter: Payer: Self-pay | Admitting: Internal Medicine

## 2011-04-07 ENCOUNTER — Ambulatory Visit (INDEPENDENT_AMBULATORY_CARE_PROVIDER_SITE_OTHER): Payer: Medicare Other | Admitting: Internal Medicine

## 2011-04-07 DIAGNOSIS — R413 Other amnesia: Secondary | ICD-10-CM

## 2011-04-07 DIAGNOSIS — I509 Heart failure, unspecified: Secondary | ICD-10-CM

## 2011-04-07 MED ORDER — PROPRANOLOL HCL 20 MG PO TABS: 20.0000 mg | ORAL_TABLET | Freq: Two times a day (BID) | ORAL | Status: DC

## 2011-04-07 MED ORDER — TRAMADOL HCL 50 MG PO TABS: 50.0000 mg | ORAL_TABLET | Freq: Three times a day (TID) | ORAL | Status: AC | PRN

## 2011-04-07 MED ORDER — FUROSEMIDE 40 MG PO TABS: 40.0000 mg | ORAL_TABLET | Freq: Every day | ORAL | Status: DC

## 2011-04-07 MED ORDER — POTASSIUM CHLORIDE CRYS ER 20 MEQ PO TBCR: 20.0000 meq | EXTENDED_RELEASE_TABLET | Freq: Every day | ORAL | Status: DC

## 2011-04-07 NOTE — Progress Notes (Signed)
Subjective:    Patient ID: Diane [name], female    DOB: Aug 28, 1924, 77 y.o.   MRN: [id number]  HPI Patient returns for follow-up. In the interval she has had no new problems. She has continued to take all her current meds. There is a concern from her caretaker about the need for medications. Rx needed refill.   Past Medical History  Diagnosis Date  . Arthritis     worst:hands and shoulder  . GERD (gastroesophageal reflux disease)   . Glaucoma     s/p laser surgery  . Hyperlipidemia   . Heart murmur     MVR, AVS  . Migraine     resolved with menopause  . CHF (congestive heart failure), NYHA class IV     had been a hospice patient  . Cystitis     h/o frequent bouts  . Measles     childhood  . Varicella without complication     chilodhood  . Pneumonia 2004   Past Surgical History  Procedure Date  . Breast biopsy   . Appendectomy     '41  . Tonsillectomy and adenoidectomy '47  . Cholecystectomy 1980's  . Abdominal hysterectomy     1969  . G4p2   . Shoulder arthroscopy     right   Family History  Problem Relation Age of Onset  . Alcohol abuse Father    History   Social History  . Marital Status: Widowed    Spouse Name: N/A    Number of Children: N/A  . Years of Education: 13   Occupational History  .     Social History Main Topics  . Smoking status: Never Smoker   . Smokeless tobacco: Never Used  . Alcohol Use: No  . Drug Use: No  . Sexually Active: Not on file   Other Topics Concern  . 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 1 son - '59, 1 dtr -'4; 4 grandchildren, 5 great-grands. Lives alone but looked in on. End of life care - DNR/DNI       Review of Systems Review of Systems  Constitutional:  Negative for fever, chills, activity change and unexpected weight change.  HENT:  Negative for hearing loss, ear pain, congestion, neck stiffness and postnasal drip.   Eyes: Negative for pain, discharge and  visual disturbance.  Respiratory: Negative for chest tightness and wheezing.   Cardiovascular: Negative for chest pain and palpitations.       [No decreased exercise tolerance Gastrointestinal: [No change in bowel habit. No bloating or gas. No reflux or indigestion Genitourinary: Negative for urgency, frequency, flank pain and difficulty urinating.  Musculoskeletal: Negative for myalgias, back pain, arthralgias and gait problem.  Neurological: Negative for dizziness, tremors, weakness and headaches.  Hematological: Negative for adenopathy.  Psychiatric/Behavioral: Negative for behavioral problems and dysphoric mood.       Objective:   Physical Exam Vitals reviewed. Gen'l - WNWD white woman in no distress HEENT - grossly normal Resp- normal with no work of breathing Cor - RRR MMSE: 1. Day,date,year - ok 2. Content: president- ok   Gov. - ok Current events - ok 3. Number repitition: 5 fwd - ok  5 rev - 1 error  World reversed - ok 4. 3 word recall - 2/3 5. Serial 7's - ok , nickles in $1.25- ok  Change making - ok 6. Naming objects - ok  4 legged creatures ok 7. Parables:  Glass [place] -  Concrete  Rolling stone - better 8. Judgement:  Letter ok   Fire ok 9. Clock face exercise-well done and fluid       Assessment & Plan:  1. CHF - doing well with no clinical signs of failure  Plan - continue present medications.           Follow-up with Dr. Allyson Sabal as instructed.  2. Memory loss/dementia - patient performed very well on MMSE. No indication for medical therapy at this time.  Plan - follow-up MMSE in 6 months.

## 2011-04-07 NOTE — Patient Instructions (Signed)
Chronic cystitis - macrodantin is dangerous in a patient your age. Please stop the macrodantin. If you get symptoms of a bladder infection call the office, option #3 leave a message and an order of a urinalysis will be put in to check for infection.   All your medications have been renewed that were due.  You did well on a mini-mental status exam and there is no indication for any medications for memory.   Return to see me in 3 months.

## 2011-04-14 ENCOUNTER — Ambulatory Visit: Payer: PRIVATE HEALTH INSURANCE | Admitting: Internal Medicine

## 2011-04-15 ENCOUNTER — Telehealth: Payer: Self-pay | Admitting: *Deleted

## 2011-04-15 ENCOUNTER — Other Ambulatory Visit: Payer: Self-pay | Admitting: *Deleted

## 2011-04-15 DIAGNOSIS — R350 Frequency of micturition: Secondary | ICD-10-CM

## 2011-04-15 MED ORDER — POTASSIUM CHLORIDE CRYS ER 20 MEQ PO TBCR: 20.0000 meq | EXTENDED_RELEASE_TABLET | Freq: Every day | ORAL | Status: DC

## 2011-04-15 MED ORDER — FUROSEMIDE 40 MG PO TABS: 40.0000 mg | ORAL_TABLET | Freq: Every day | ORAL | Status: DC

## 2011-04-15 NOTE — Telephone Encounter (Signed)
Ok for u/a  595.0 

## 2011-04-15 NOTE — Telephone Encounter (Signed)
Caller states that Pt has long Hx of Cystitis and is having symptoms of [dysuria] and is requesting that we put an order in for UA. Please advise.

## 2011-04-15 NOTE — Telephone Encounter (Signed)
Caller states that Pt's insurance will only pay for #90-day cycle on prescriptions and request that we send all Rx[s] in for #90 Chart shows that this was done on 04/07/11 to [phone]. Called and spoke w/pharmacist who stated that he "doesn't know what goes on when I'm not here in the building or what people do when I'm not here". ? Informed that Pt's meds [4] sent in had been picked up, but insurance did not cover Klor-Con and/or Furosemide [did not know why?]  Placed call to Pt's caller and was told that Klor-Con & Furosemide must be sent through [facility] to be covered by insurance [did not know why?] Rxs [2] sent in to [facility] per escript.

## 2011-04-15 NOTE — Telephone Encounter (Signed)
Spoke w/Linda Engineer, production and she states that this order is not urgent--nothing out of the ordinary. She is aware that MD out of town until Friday morning and that will be fine for the order.

## 2011-04-16 ENCOUNTER — Other Ambulatory Visit (INDEPENDENT_AMBULATORY_CARE_PROVIDER_SITE_OTHER): Payer: PRIVATE HEALTH INSURANCE

## 2011-04-16 DIAGNOSIS — R350 Frequency of micturition: Secondary | ICD-10-CM

## 2011-04-16 LAB — URINALYSIS, ROUTINE W REFLEX MICROSCOPIC
Nitrite: POSITIVE
Urine Glucose: NEGATIVE
Urobilinogen, UA: 0.2 (ref 0.0–1.0)

## 2011-04-16 NOTE — Telephone Encounter (Signed)
U/a ordered, daughter aware, hold open for results.

## 2011-04-16 NOTE — Telephone Encounter (Signed)
Results ready, please advise.

## 2011-04-16 NOTE — Telephone Encounter (Signed)
Positive U/A - cipro 250mg  bid x 5 days , # 10

## 2011-04-17 MED ORDER — CIPROFLOXACIN HCL 250 MG PO TABS: 250.0000 mg | ORAL_TABLET | Freq: Two times a day (BID) | ORAL | Status: AC

## 2011-04-17 NOTE — Telephone Encounter (Signed)
Daughter informed

## 2011-04-27 ENCOUNTER — Other Ambulatory Visit: Payer: Self-pay | Admitting: *Deleted

## 2011-04-27 MED ORDER — POTASSIUM CHLORIDE CRYS ER 20 MEQ PO TBCR: 20.0000 meq | EXTENDED_RELEASE_TABLET | Freq: Every day | ORAL | Status: DC

## 2011-04-27 MED ORDER — OMEPRAZOLE 40 MG PO CPDR: 40.0000 mg | DELAYED_RELEASE_CAPSULE | Freq: Every day | ORAL | Status: DC

## 2011-04-27 MED ORDER — PROPRANOLOL HCL 20 MG PO TABS: 20.0000 mg | ORAL_TABLET | Freq: Two times a day (BID) | ORAL | Status: DC

## 2011-06-29 ENCOUNTER — Telehealth: Payer: Self-pay

## 2011-06-29 NOTE — Telephone Encounter (Signed)
Bonita Quin called to inform MD that pt has been experiencing an increase in confusion, 2 lbs weigh gain and complaining of being sleepy and "head feeling heavy". Bonita Quin gave pt an extra lasix for weight gain but is requesting additional advisement from MD

## 2011-06-29 NOTE — Telephone Encounter (Signed)
Agree that for the weight gain she should double the lasix for two days. Re increased confusion - may need OV later this week if symptoms don't clear

## 2011-06-30 NOTE — Telephone Encounter (Signed)
Informed pts daughter Bonita Quin, she is doing much better

## 2011-06-30 NOTE — Telephone Encounter (Signed)
K

## 2011-07-08 ENCOUNTER — Other Ambulatory Visit: Payer: Self-pay | Admitting: Internal Medicine

## 2011-07-08 ENCOUNTER — Other Ambulatory Visit (INDEPENDENT_AMBULATORY_CARE_PROVIDER_SITE_OTHER): Payer: Medicare Other

## 2011-07-08 ENCOUNTER — Other Ambulatory Visit: Payer: Medicare Other

## 2011-07-08 ENCOUNTER — Ambulatory Visit (INDEPENDENT_AMBULATORY_CARE_PROVIDER_SITE_OTHER): Payer: Medicare Other | Admitting: Internal Medicine

## 2011-07-08 DIAGNOSIS — R413 Other amnesia: Secondary | ICD-10-CM

## 2011-07-08 DIAGNOSIS — E785 Hyperlipidemia, unspecified: Secondary | ICD-10-CM

## 2011-07-08 DIAGNOSIS — K219 Gastro-esophageal reflux disease without esophagitis: Secondary | ICD-10-CM

## 2011-07-08 DIAGNOSIS — I509 Heart failure, unspecified: Secondary | ICD-10-CM

## 2011-07-08 LAB — COMPREHENSIVE METABOLIC PANEL
CO2: 29 mEq/L (ref 19–32)
GFR: 81.81 mL/min (ref >60.00)
Glucose, Bld: 108 mg/dL — ABNORMAL HIGH (ref 70–99)
Sodium: 132 mEq/L — ABNORMAL LOW (ref 135–145)
Total Bilirubin: 0.5 mg/dL (ref 0.3–1.2)
Total Protein: 7.2 g/dL (ref 6.0–8.3)

## 2011-07-08 LAB — TSH: TSH: 1.86 u[IU]/mL (ref 0.35–5.50)

## 2011-07-08 LAB — LIPID PANEL: HDL: 71.9 mg/dL (ref >39.00)

## 2011-07-08 NOTE — Assessment & Plan Note (Signed)
Stable at today's visit with no sign of decompensation. She did have an episode of wt gain that did respond well to increased dose x 1 of furosemide. She has no activity limitations.  Plan - continue prsent meds           Bmet for routine monitoring           Keep f/u with Dr. Allyson Sabal.

## 2011-07-08 NOTE — Assessment & Plan Note (Signed)
patinet is doing well on no medications.  Plan - lipid panel with recommendations to follow

## 2011-07-08 NOTE — Assessment & Plan Note (Signed)
Stable on present dosing of PPI.   Plan - no change in regimen

## 2011-07-08 NOTE — Assessment & Plan Note (Signed)
D-in-L reports progressive repetition of phrases and information, difficulty with shopping lists. Can get confused quit easily. Not at this time ready for medical intervention.   Plan - repeat MMSE at next ov.

## 2011-07-08 NOTE — Progress Notes (Signed)
  Subjective:    Patient ID: Diane [name], female    DOB: 11-Jul-1924, 77 y.o.   MRN: [id number]  HPI Diane [name] presents for follow up. She had an episode of increased confusion August 20. Her daughter - in - law saw her. She had had some weight gain and missed her meds. With increased lasix and routine meds she cleared in several hours. Her d-i-l does report some persistent memory loss and cognitive impairment that is slowly progressive. Reviewed MMSE from May 20, '12 and she had one well. Was not to a point where she needed any medication.   Past Medical History  Diagnosis Date  . Arthritis     worst:hands and shoulder  . GERD (gastroesophageal reflux disease)   . Glaucoma     s/p laser surgery  . Hyperlipidemia   . Heart murmur     MVR, AVS  . Migraine     resolved with menopause  . CHF (congestive heart failure), NYHA class IV     had been a hospice patient  . Cystitis     h/o frequent bouts  . Measles     childhood  . Varicella without complication     chilodhood  . Pneumonia 2004   Past Surgical History  Procedure Date  . Breast biopsy   . Appendectomy     '41  . Tonsillectomy and adenoidectomy '47  . Cholecystectomy 1980's  . Abdominal hysterectomy     1969  . G4p2   . Shoulder arthroscopy     right   Family History  Problem Relation Age of Onset  . Alcohol abuse Father    History   Social History  . Marital Status: Widowed    Spouse Name: N/A    Number of Children: N/A  . Years of Education: 13   Occupational History  .     Social History Main Topics  . Smoking status: Never Smoker   . Smokeless tobacco: Never Used  . Alcohol Use: No  . Drug Use: No  . Sexually Active: Not on file   Other Topics Concern  . 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 1 son - '59, 1 dtr -'59; 4 grandchildren, 5 great-grands. Lives alone but looked in on. End of life care - DNR/DNI       Review of Systems System  review is negative for any constitutional, cardiac, pulmonary, GI or neuro symptoms or complaints     Objective:   Physical Exam Vitals reviewed - normal Gen'l - elderly white woman in no distress HEENT - C&S clear, PERRLA Cor - RRR, II/VI systolic mm best at LSB and apex. Question diastolic component Pul - normal respirations, no wheeze, no rales Abdomen - soft.        Assessment & Plan:

## 2011-07-13 ENCOUNTER — Encounter: Payer: Self-pay | Admitting: Internal Medicine

## 2011-07-30 ENCOUNTER — Other Ambulatory Visit (INDEPENDENT_AMBULATORY_CARE_PROVIDER_SITE_OTHER): Payer: Medicare Other

## 2011-07-30 ENCOUNTER — Other Ambulatory Visit: Payer: Self-pay | Admitting: Internal Medicine

## 2011-07-30 ENCOUNTER — Telehealth: Payer: Self-pay | Admitting: *Deleted

## 2011-07-30 DIAGNOSIS — R350 Frequency of micturition: Secondary | ICD-10-CM

## 2011-07-30 LAB — URINALYSIS, ROUTINE W REFLEX MICROSCOPIC
Bilirubin Urine: NEGATIVE
Nitrite: POSITIVE
Specific Gravity, Urine: 1.01 (ref 1.000–1.030)
pH: 6.5 (ref 5.0–8.0)

## 2011-07-30 MED ORDER — CIPROFLOXACIN HCL 250 MG PO TABS: 250.0000 mg | ORAL_TABLET | Freq: Two times a day (BID) | ORAL | Status: AC

## 2011-07-30 NOTE — Telephone Encounter (Signed)
Spoke with daughter, Pt c/o UTI and wants u/a. OK per Dr Posey Rea. Daughter informed and will bring patient in today for labs. HOLD PHONE NOTE OPEN FOR RESULTS

## 2011-07-30 NOTE — Telephone Encounter (Signed)
Results ready, Pt would like RX for cipro. Please advise.

## 2011-07-30 NOTE — Telephone Encounter (Signed)
Ok Cipro if she can take it Thx

## 2011-07-31 NOTE — Telephone Encounter (Signed)
Daughter informed

## 2011-09-14 ENCOUNTER — Ambulatory Visit (INDEPENDENT_AMBULATORY_CARE_PROVIDER_SITE_OTHER): Payer: Medicare Other | Admitting: Internal Medicine

## 2011-09-14 VITALS — BP 128/72 | HR 80 | Temp 98.1°F | Wt 124.0 lb

## 2011-09-14 DIAGNOSIS — H6120 Impacted cerumen, unspecified ear: Secondary | ICD-10-CM

## 2011-09-14 NOTE — Progress Notes (Signed)
  Subjective:    Patient ID: Diane [name], female    DOB: 13-Dec-1923, 77 y.o.   MRN: [id number]  HPI Diane [name] presents for cerumen impaction in right ear that is interfering with functioning of hearing aide. She is otherwise doing well with no acute problems.  I have reviewed the patient's medical history in detail and updated the computerized patient record.    Review of Systems System review is negative for any constitutional, cardiac, pulmonary, GI or neuro symptoms or complaints other than as described in the HPI.     Objective:   Physical Exam Vitals - stable  Gen'l - elderly white female in no distress HEENT- cerumen impaction Right, left is clear       Assessment & Plan:  Cerumen impaction-right: easily irrigated

## 2011-10-12 ENCOUNTER — Ambulatory Visit (INDEPENDENT_AMBULATORY_CARE_PROVIDER_SITE_OTHER): Payer: Medicare Other | Admitting: Internal Medicine

## 2011-10-12 ENCOUNTER — Other Ambulatory Visit: Payer: Medicare Other

## 2011-10-12 ENCOUNTER — Encounter: Payer: Self-pay | Admitting: Internal Medicine

## 2011-10-12 VITALS — BP 124/68 | HR 85 | Temp 97.2°F

## 2011-10-12 DIAGNOSIS — N390 Urinary tract infection, site not specified: Secondary | ICD-10-CM

## 2011-10-12 LAB — POCT URINALYSIS DIPSTICK
Nitrite, UA: NEGATIVE
Protein, UA: NEGATIVE
Urobilinogen, UA: 0.2
pH, UA: 7

## 2011-10-12 MED ORDER — CIPROFLOXACIN HCL 250 MG PO TABS: 250.0000 mg | ORAL_TABLET | Freq: Two times a day (BID) | ORAL | Status: AC

## 2011-10-12 NOTE — Progress Notes (Signed)
HPI:  complains of UTI symptoms Onset 5 days ago associated with dysuria and small volume voiding with increased frequency denies hematuria, flank pain or fever The patient has a history of same  Past Medical History  Diagnosis Date  . Arthritis     worst:hands and shoulder  . GERD (gastroesophageal reflux disease)   . Glaucoma     s/p laser surgery  . Hyperlipidemia   . Heart murmur     MVR, AVS  . Migraine     resolved with menopause  . CHF (congestive heart failure), NYHA class IV     had been a hospice patient  . Cystitis     h/o frequent bouts  . Measles     childhood  . Varicella without complication     chilodhood  . Pneumonia 2004   ROS:  Gen: no fever, no unexpected weight changes Lungs: no cough or shortness of breath GU: no vaginal discharge or hematuria  Exam: BP 124/68  Pulse 85  Temp(Src) 97.2 F (36.2 C) (Oral)  SpO2 97% Wt Readings from Last 3 Encounters:  09/14/11 124 lb (56.246 kg)  07/08/11 123 lb (55.792 kg)  04/07/11 119 lb (53.978 kg)   Constitutional: She appears well-developed and well-nourished. No distress.  Dtr at side Cardiovascular: Normal rate, regular rhythm and normal heart sounds.  No murmur heard. No BLE edema. Pulmonary/Chest: Effort normal and breath sounds normal. No respiratory distress. She has no wheezes.  Abdominal: Soft. Bowel sounds are normal. She exhibits no distension. There is no tenderness. no masses   Lab Results  Component Value Date   WBC 10.7* 11/02/2010   HGB 11.7* 11/02/2010   HCT 35.6* 11/02/2010   PLT 334 11/02/2010   GLUCOSE 108* 07/08/2011   CHOL 210* 07/08/2011   TRIG 74.0 07/08/2011   HDL 71.90 07/08/2011   LDLDIRECT 134.8 07/08/2011   ALT 12 07/08/2011   AST 20 07/08/2011   NA 132* 07/08/2011   K 4.9 07/08/2011   CL 95* 07/08/2011   CREATININE 0.7 07/08/2011   BUN 10 07/08/2011   CO2 29 07/08/2011   TSH 1.86 07/08/2011   Assessment and Plan:  UTI - positive Udip today> will send for urine culture  to confirm. Risk factors for recurrence bladder infection discussed with patient and daughter today including incontinence. Empiric Cipro twice a day for 7 days -has used same in past with good relief of symptoms but multiple med allergies reviewed

## 2011-10-12 NOTE — Patient Instructions (Signed)
It was good to see you today. Cipro antibiotics 2x/day x 1 week - Your prescription(s) have been submitted to your pharmacy. Please take as directed and contact our office if you believe you are having problem(s) with the medication(s). Urine culture ordered today. Your results will be called to you after review (48-72hours after test completion). If any changes need to be made, you will be notified at that time.

## 2011-10-14 LAB — URINE CULTURE

## 2012-01-07 ENCOUNTER — Ambulatory Visit (INDEPENDENT_AMBULATORY_CARE_PROVIDER_SITE_OTHER): Payer: Medicare Other | Admitting: Internal Medicine

## 2012-01-07 ENCOUNTER — Other Ambulatory Visit (INDEPENDENT_AMBULATORY_CARE_PROVIDER_SITE_OTHER): Payer: Medicare Other

## 2012-01-07 ENCOUNTER — Encounter: Payer: Self-pay | Admitting: Internal Medicine

## 2012-01-07 DIAGNOSIS — M1990 Unspecified osteoarthritis, unspecified site: Secondary | ICD-10-CM

## 2012-01-07 DIAGNOSIS — I509 Heart failure, unspecified: Secondary | ICD-10-CM

## 2012-01-07 DIAGNOSIS — M129 Arthropathy, unspecified: Secondary | ICD-10-CM

## 2012-01-07 DIAGNOSIS — R0989 Other specified symptoms and signs involving the circulatory and respiratory systems: Secondary | ICD-10-CM

## 2012-01-07 DIAGNOSIS — K219 Gastro-esophageal reflux disease without esophagitis: Secondary | ICD-10-CM

## 2012-01-07 DIAGNOSIS — E785 Hyperlipidemia, unspecified: Secondary | ICD-10-CM

## 2012-01-07 DIAGNOSIS — R0609 Other forms of dyspnea: Secondary | ICD-10-CM

## 2012-01-07 LAB — CBC WITH DIFFERENTIAL/PLATELET
Basophils Absolute: 0 10*3/uL (ref 0.0–0.1)
Eosinophils Absolute: 0.2 10*3/uL (ref 0.0–0.7)
Hemoglobin: 14.1 g/dL (ref 12.0–15.0)
Lymphocytes Relative: 36.1 % (ref 12.0–46.0)
Lymphs Abs: 2.6 10*3/uL (ref 0.7–4.0)
MCHC: 33.9 g/dL (ref 30.0–36.0)
Neutro Abs: 3.8 10*3/uL (ref 1.4–7.7)
Platelets: 283 10*3/uL (ref 150.0–400.0)
RDW: 14 % (ref 11.5–14.6)

## 2012-01-07 LAB — LIPID PANEL
HDL: 68.5 mg/dL (ref >39.00)
Total CHOL/HDL Ratio: 3
VLDL: 21.4 mg/dL (ref 0.0–40.0)

## 2012-01-07 LAB — HEPATIC FUNCTION PANEL
Bilirubin, Direct: 0.1 mg/dL (ref 0.0–0.3)
Total Bilirubin: 0.5 mg/dL (ref 0.3–1.2)
Total Protein: 7.4 g/dL (ref 6.0–8.3)

## 2012-01-07 LAB — COMPREHENSIVE METABOLIC PANEL
AST: 23 U/L (ref 0–37)
Albumin: 3.8 g/dL (ref 3.5–5.2)
BUN: 10 mg/dL (ref 6–23)
Calcium: 9.3 mg/dL (ref 8.4–10.5)
Chloride: 97 mEq/L (ref 96–112)
Potassium: 3.7 mEq/L (ref 3.5–5.1)

## 2012-01-07 NOTE — Progress Notes (Signed)
Subjective:    Patient ID: Diane [name], female    DOB: January 05, 1924, 78 y.o.   MRN: [id number]  HPI Ms. Mundo presents for follow-up of chronic medical problems. Her cystitis has resolved in conjunction with a reduction in the use of feminine hygiene products.  She has had mild DOE but her weight has been very stable at 120 lbs. Her family does have her take additional lasix for an increase in weight of 2 lbs or more. She has had no chest pain. She does have pain in the right wrist and wears an isotoner glove.  Past Medical History  Diagnosis Date  . Arthritis     worst:hands and shoulder  . GERD (gastroesophageal reflux disease)   . Glaucoma     s/p laser surgery  . Hyperlipidemia   . Heart murmur     MVR, AVS  . Migraine     resolved with menopause  . CHF (congestive heart failure), NYHA class IV     had been a hospice patient  . Cystitis     h/o frequent bouts  . Measles     childhood  . Varicella without complication     chilodhood  . Pneumonia 2004   Past Surgical History  Procedure Date  . Breast biopsy   . Appendectomy     '41  . Tonsillectomy and adenoidectomy '47  . Cholecystectomy 1980's  . Abdominal hysterectomy     1969  . G4p2   . Shoulder arthroscopy     right   Family History  Problem Relation Age of Onset  . Alcohol abuse Father    History   Social History  . Marital Status: Widowed    Spouse Name: N/A    Number of Children: N/A  . Years of Education: 13   Occupational History  .     Social History Main Topics  . Smoking status: Never Smoker   . Smokeless tobacco: Never Used  . Alcohol Use: No  . Drug Use: No  . Sexually Active: Not on file   Other Topics Concern  . 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 1 son - '59, 1 dtr -'76; 4 grandchildren, 5 great-grands. Lives alone but looked in on. End of life care - DNR/DNI       Review of Systems System review is negative for any  constitutional, cardiac, pulmonary, GI or neuro symptoms or complaints other than as described in the HPI.     Objective:   Physical Exam Filed Vitals:   01/07/12 1300  BP: 112/64  Pulse: 77  Temp: 97.1 F (36.2 C)  Resp: 20   Weight: 123 lb 12 oz (56.133 kg)  Gen'l - very pleasant white woman in no distress HEENT- C&S clear PUlm - good breath sounds, no rales or wheeze Cor - RRR, II/VI murmur best at LSB Ext - trace pedal edema  Lab Results  Component Value Date   WBC 7.1 01/07/2012   HGB 14.1 01/07/2012   HCT 41.5 01/07/2012   PLT 283.0 01/07/2012   GLUCOSE 106* 01/07/2012   CHOL 232* 01/07/2012   TRIG 107.0 01/07/2012   HDL 68.50 01/07/2012   LDLDIRECT 147.6 01/07/2012        ALT 16 01/07/2012   AST 23 01/07/2012        NA 134* 01/07/2012   K 3.7 01/07/2012   CL 97 01/07/2012   CREATININE 0.8 01/07/2012  BUN 10 01/07/2012   CO2 29 01/07/2012   TSH 1.86 07/08/2011         Assessment & Plan:

## 2012-01-10 NOTE — Assessment & Plan Note (Signed)
Relatively stable. She is having some pain in the right wrist for which she is wearing an isotoner glove which does give her relief.  Blood counts and renal function are normal.  NO change in medical regimen.

## 2012-01-10 NOTE — Assessment & Plan Note (Signed)
Doing well at this time with no signs of decompensation. Her family/caretakers are following the "rule of 2" and have done a great job of managing her fluid status.  Plan - continue present regimen

## 2012-01-10 NOTE — Assessment & Plan Note (Addendum)
LDL is above goal of 130 but below the treatment threshold of 160+. Given her advanced age and small mortality benefit of aggressive treatment, intolerance of "statins," welchol and zetia  will not initiate additional medical therapy.

## 2012-04-14 ENCOUNTER — Other Ambulatory Visit: Payer: Self-pay | Admitting: Internal Medicine

## 2012-04-26 ENCOUNTER — Other Ambulatory Visit (INDEPENDENT_AMBULATORY_CARE_PROVIDER_SITE_OTHER): Payer: Medicare Other

## 2012-04-26 ENCOUNTER — Ambulatory Visit (INDEPENDENT_AMBULATORY_CARE_PROVIDER_SITE_OTHER): Payer: Medicare Other | Admitting: Internal Medicine

## 2012-04-26 ENCOUNTER — Encounter: Payer: Self-pay | Admitting: Internal Medicine

## 2012-04-26 ENCOUNTER — Telehealth: Payer: Self-pay | Admitting: Internal Medicine

## 2012-04-26 VITALS — BP 130/60 | HR 80 | Temp 97.6°F | Resp 16 | Wt 123.0 lb

## 2012-04-26 DIAGNOSIS — N3000 Acute cystitis without hematuria: Secondary | ICD-10-CM

## 2012-04-26 DIAGNOSIS — N390 Urinary tract infection, site not specified: Secondary | ICD-10-CM

## 2012-04-26 LAB — URINALYSIS, ROUTINE W REFLEX MICROSCOPIC
Bilirubin Urine: NEGATIVE
Total Protein, Urine: NEGATIVE
Urine Glucose: NEGATIVE

## 2012-04-26 MED ORDER — CIPROFLOXACIN HCL 250 MG PO TABS: 250.0000 mg | ORAL_TABLET | Freq: Two times a day (BID) | ORAL | Status: AC

## 2012-04-26 NOTE — Telephone Encounter (Signed)
The pt is hoping to get worked in for bladder problems sometime soon.  Please let me know if you want her added today or tomorrow.    Thanks!

## 2012-04-26 NOTE — Patient Instructions (Addendum)
Recurrent urinary track infection. Plan 1) cipro 250 mg twice a day for 7 days. 2) continue cranberry juice. 3) NO PANTY LINERS!!!!!  Urinary Tract Infection Infections of the urinary tract can start in several places. A bladder infection (cystitis), a kidney infection (pyelonephritis), and a prostate infection (prostatitis) are different types of urinary tract infections (UTIs). They usually get better if treated with medicines (antibiotics) that kill germs. Take all the medicine until it is gone. You or your child may feel better in a few days, but TAKE ALL MEDICINE or the infection may not respond and may become more difficult to treat. HOME CARE INSTRUCTIONS    Drink enough water and fluids to keep the urine clear or pale yellow. Cranberry juice is especially recommended, in addition to large amounts of water.   Avoid caffeine, tea, and carbonated beverages. They tend to irritate the bladder.   Alcohol may irritate the prostate.   Only take over-the-counter or prescription medicines for pain, discomfort, or fever as directed by your caregiver.  To prevent further infections:  Empty the bladder often. Avoid holding urine for long periods of time.   After a bowel movement, women should cleanse from front to back. Use each tissue only once.   Empty the bladder before and after sexual intercourse.  FINDING OUT THE RESULTS OF YOUR TEST Not all test results are available during your visit. If your or your child's test results are not back during the visit, make an appointment with your caregiver to find out the results. Do not assume everything is normal if you have not heard from your caregiver or the medical facility. It is important for you to follow up on all test results. SEEK MEDICAL CARE IF:    There is back pain.   Your baby is older than 3 months with a rectal temperature of 100.5 F (38.1 C) or higher for more than 1 day.   Your or your child's problems (symptoms) are no better  in 3 days. Return sooner if you or your child is getting worse.  SEEK IMMEDIATE MEDICAL CARE IF:    There is severe back pain or lower abdominal pain.   You or your child develops chills.   You have a fever.   Your baby is older than 3 months with a rectal temperature of 102 F (38.9 C) or higher.   Your baby is 74 months old or younger with a rectal temperature of 100.4 F (38 C) or higher.   There is nausea or vomiting.   There is continued burning or discomfort with urination.  MAKE SURE YOU:    Understand these instructions.   Will watch your condition.   Will get help right away if you are not doing well or get worse.  Document Released: 08/05/2005 Document Revised: 10/15/2011 Document Reviewed: 03/10/2007 [facility] Patient Information 2012 [place], [place].

## 2012-04-26 NOTE — Telephone Encounter (Signed)
If concern is for bladder infection may come by for U/A today and quick visit to follow. Order for U/A entered.

## 2012-04-27 NOTE — Progress Notes (Signed)
Subjective:    Patient ID: Diane [name], female    DOB: [birth year], 78 y.o.   MRN: [id number]  HPI Patient prensent for evaluation of dysuria. Her daughter reports that she has been doing well otherwise - more stable over the past several months that before.  Past Medical History  Diagnosis Date  . Arthritis     worst:hands and shoulder  . GERD (gastroesophageal reflux disease)   . Glaucoma     s/p laser surgery  . Hyperlipidemia   . Heart murmur     MVR, AVS  . Migraine     resolved with menopause  . CHF (congestive heart failure), NYHA class IV     had been a hospice patient  . Cystitis     h/o frequent bouts  . Measles     childhood  . Varicella without complication     chilodhood  . Pneumonia 2004   Past Surgical History  Procedure Date  . Breast biopsy   . Appendectomy     '41  . Tonsillectomy and adenoidectomy '47  . Cholecystectomy 1980's  . Abdominal hysterectomy     1969  . G4p2   . Shoulder arthroscopy     right   Family History  Problem Relation Age of Onset  . Alcohol abuse Father    History   Social History  . Marital Status: Widowed    Spouse Name: N/A    Number of Children: N/A  . Years of Education: 13   Occupational History  .     Social History Main Topics  . Smoking status: Never Smoker   . Smokeless tobacco: Never Used  . Alcohol Use: No  . Drug Use: No  . Sexually Active: Not on file   Other Topics Concern  . 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 1 son - '59, 1 dtr -'6; 4 grandchildren, 5 great-grands. Lives alone but looked in on. End of life care - DNR/DNI     Current Outpatient Prescriptions on File Prior to Visit  Medication Sig Dispense Refill  . aspirin EC 81 MG tablet Take 81 mg by mouth daily.        . Cranberry 1000 MG CAPS Take 1 each by mouth daily.      . furosemide (LASIX) 40 MG tablet TAKE 1 TABLET DAILY  90 tablet  3  . Loratadine (CLARITIN) 10 MG CAPS Take by  mouth.        . MULTIPLE VITAMIN PO Take by mouth.        Marland Kitchen omeprazole (PRILOSEC) 40 MG capsule Take 1 capsule (40 mg total) by mouth daily.  90 capsule  3  . potassium chloride SA (K-DUR,KLOR-CON) 20 MEQ tablet Take 1 tablet (20 mEq total) by mouth daily.  90 tablet  3  . Probiotic Product (PROBIOTIC & ACIDOPHILUS EX ST PO) Take 1 each by mouth daily.      . propranolol (INDERAL) 20 MG tablet Take 1 tablet (20 mg total) by mouth 2 (two) times daily.  180 tablet  3       Review of Systems Bladder pain with micturition, frequency, urgency    Objective:   Physical Exam Filed Vitals:   04/26/12 1613  BP: 130/60  Pulse: 80  Temp: 97.6 F (36.4 C)  Resp: 16   Alert elderly woman Cor- RRR Pulm - normal  Abd - no flank tenderness, tender in the suprapubic region.  Urine dipstick shows positive for WBC's and positive for leukocytes.  Micro exam: TNTC WBC's per HPF and 2+ bacteria.       Assessment & Plan:  UTI  Plan   cipro 250 mg bid x 7

## 2012-05-27 NOTE — Telephone Encounter (Signed)
error 

## 2012-07-06 ENCOUNTER — Encounter: Payer: Self-pay | Admitting: Internal Medicine

## 2012-07-06 ENCOUNTER — Ambulatory Visit (INDEPENDENT_AMBULATORY_CARE_PROVIDER_SITE_OTHER): Payer: Medicare Other | Admitting: Internal Medicine

## 2012-07-06 ENCOUNTER — Other Ambulatory Visit (INDEPENDENT_AMBULATORY_CARE_PROVIDER_SITE_OTHER): Payer: Medicare Other

## 2012-07-06 VITALS — BP 112/58 | HR 79 | Temp 97.1°F | Resp 14 | Wt 122.0 lb

## 2012-07-06 DIAGNOSIS — R413 Other amnesia: Secondary | ICD-10-CM

## 2012-07-06 DIAGNOSIS — E785 Hyperlipidemia, unspecified: Secondary | ICD-10-CM

## 2012-07-06 DIAGNOSIS — I509 Heart failure, unspecified: Secondary | ICD-10-CM

## 2012-07-06 LAB — LIPID PANEL
Cholesterol: 220 mg/dL — ABNORMAL HIGH (ref 0–200)
HDL: 68.4 mg/dL (ref >39.00)
Triglycerides: 107 mg/dL (ref 0.0–149.0)
VLDL: 21.4 mg/dL (ref 0.0–40.0)

## 2012-07-06 LAB — COMPREHENSIVE METABOLIC PANEL
AST: 20 U/L (ref 0–37)
Alkaline Phosphatase: 143 U/L — ABNORMAL HIGH (ref 39–117)
BUN: 10 mg/dL (ref 6–23)
Calcium: 9.2 mg/dL (ref 8.4–10.5)
Creatinine, Ser: 0.8 mg/dL (ref 0.4–1.2)
Total Bilirubin: 0.8 mg/dL (ref 0.3–1.2)

## 2012-07-06 NOTE — Assessment & Plan Note (Signed)
Stable weight per home record. Lungs are clear.  Plan  Continue present meds.

## 2012-07-06 NOTE — Patient Instructions (Addendum)
You seems to be doing great. Plan is for routine follow-up lab today. You will be sent a letter with results.   If you have continued trouble with swelling let me know.

## 2012-07-06 NOTE — Assessment & Plan Note (Signed)
Will recheck cholesterol level to be sure you don'e need any medication

## 2012-07-06 NOTE — Progress Notes (Signed)
Subjective:    Patient ID: Diane [name], female    DOB: 11-Jun-1924, 78 y.o.   MRN: [id number]  HPI Mrs. Quilling was last seen in June for a UTI which has resolved. She was seen in February for gen'l care. IN the interval she has had no SOB, no chest pain. Her care-giver reports that she has good and bad days: less energy, more general fatigue. She does monitor her weight daily: her weight has been 120 - 122 lbs consistently. She has been feeling well. She does have some general fatigue but no c/p, dizzyness or SOB.   Past Medical History  Diagnosis Date  . Arthritis     worst:hands and shoulder  . GERD (gastroesophageal reflux disease)   . Glaucoma     s/p laser surgery  . Hyperlipidemia   . Heart murmur     MVR, AVS  . Migraine     resolved with menopause  . CHF (congestive heart failure), NYHA class IV     had been a hospice patient  . Cystitis     h/o frequent bouts  . Measles     childhood  . Varicella without complication     chilodhood  . Pneumonia 2004   Past Surgical History  Procedure Date  . Breast biopsy   . Appendectomy     '41  . Tonsillectomy and adenoidectomy '47  . Cholecystectomy 1980's  . Abdominal hysterectomy     1969  . G4p2   . Shoulder arthroscopy     right   Family History  Problem Relation Age of Onset  . Alcohol abuse Father    History   Social History  . Marital Status: Widowed    Spouse Name: N/A    Number of Children: N/A  . Years of Education: 13   Occupational History  .     Social History Main Topics  . Smoking status: Never Smoker   . Smokeless tobacco: Never Used  . Alcohol Use: No  . Drug Use: No  . Sexually Active: Not on file   Other Topics Concern  . Not on file   Social History Narrative   HSG, CMA.  Married '48 - 65yrs/widowed. 1 son - '59, 1 dtr -'47; 4 grandchildren, 5 great-grands. Lives alone but looked in on. End of life care - DNR/DNI    Current Outpatient Prescriptions on File Prior to Visit    Medication Sig Dispense Refill  . aspirin EC 81 MG tablet Take 81 mg by mouth daily.        . Cranberry 1000 MG CAPS Take 1 each by mouth daily.      . furosemide (LASIX) 40 MG tablet TAKE 1 TABLET DAILY  90 tablet  3  . Loratadine (CLARITIN) 10 MG CAPS Take by mouth.        . MULTIPLE VITAMIN PO Take by mouth.        Marland Kitchen omeprazole (PRILOSEC) 40 MG capsule Take 1 capsule (40 mg total) by mouth daily.  90 capsule  3  . potassium chloride SA (K-DUR,KLOR-CON) 20 MEQ tablet Take 1 tablet (20 mEq total) by mouth daily.  90 tablet  3  . Probiotic Product (PROBIOTIC & ACIDOPHILUS EX ST PO) Take 1 each by mouth daily.      . propranolol (INDERAL) 20 MG tablet Take 1 tablet (20 mg total) by mouth 2 (two) times daily.  180 tablet  3     Review of Systems System review is negative  for any constitutional, cardiac, pulmonary, GI or neuro symptoms or complaints other than as described in the HPI.     Objective:   Physical Exam Filed Vitals:   07/06/12 1307  BP: 112/58  Pulse: 79  Temp: 97.1 F (36.2 C)  Resp: 14   Wt Readings from Last 3 Encounters:  07/06/12 122 lb (55.339 kg)  04/26/12 123 lb (55.792 kg)  01/07/12 123 lb 12 oz (56.133 kg)   Gen'l- WNWD elderly whtie woman in no distress HEENT- C&S clear Neck - supple Cor 2+ radial, RRR, III/VI systolic mm best at right chest. Pulm - clear to A&P Abd- soft, bilateral bruits at the level of the renal arteries. Neuro - HOH, otherwise non-focal.       Assessment & Plan:

## 2012-07-07 ENCOUNTER — Telehealth: Payer: Self-pay

## 2012-07-07 NOTE — Telephone Encounter (Signed)
°  Caller: Linda/Care Giver/ Daughter in Social worker; Patient Name: Dulcy Fanny; PCP: [name] (Adults only); Best Callback Phone Number: [phone].  Linda calling for a prescription for Cipro to treat pt for Cystitis. The pt called Bonita Quin and said she had pain in her lower abdomen that felt like Cystitis.  Caller asked for prescription to be called into [facility] on [phone].  Advised caller I was going to call pt and triage.  Follow up call with pt @ 1524  Pt says she took some baking soda and Anacin and she feels better.  Pt denies pain with urination, changes in urinating patterns, or fever.  Pt had pain in her lower abdomen that is now resolved.  All emergent symptoms ruled out per Urinary symptoms.  See Provider in 2 weeks  disposition due to 'All other situations'.  Home care advice given and to call back.  PLEASE FOLLOW UP WITH CALLER ABOUT PRESCRIPTION REQUEST. Thank you.

## 2012-07-07 NOTE — Assessment & Plan Note (Signed)
Stable and according to family member w/o marked change

## 2012-07-07 NOTE — Telephone Encounter (Signed)
Pt's daughter-in-law called stating pt has been complaining of cystitis sxs- burning, mild lower back pain. Daughter-in-law is requesting Rx to treat, please advise.

## 2012-07-10 ENCOUNTER — Encounter: Payer: Self-pay | Admitting: Internal Medicine

## 2012-07-23 ENCOUNTER — Other Ambulatory Visit: Payer: Self-pay | Admitting: Internal Medicine

## 2012-07-28 ENCOUNTER — Other Ambulatory Visit: Payer: Self-pay | Admitting: *Deleted

## 2012-07-28 MED ORDER — POTASSIUM CHLORIDE CRYS ER 20 MEQ PO TBCR: 20.0000 meq | EXTENDED_RELEASE_TABLET | Freq: Every day | ORAL | Status: DC

## 2012-08-19 ENCOUNTER — Other Ambulatory Visit: Payer: Self-pay | Admitting: Internal Medicine

## 2012-08-19 ENCOUNTER — Telehealth: Payer: Self-pay | Admitting: Internal Medicine

## 2012-08-19 NOTE — Telephone Encounter (Signed)
Caller: Llinda/Daughter-n-law. Other; Patient Name: Diane [name]; PCP: [name] (Adults only); Best Callback Phone Number: [phone].  Call regarding Propranolol script.  Per Epic, Propranolol sent to [facility].  Caretaker/Daughter-n-law verbalized understanding.

## 2012-08-31 ENCOUNTER — Ambulatory Visit: Payer: Medicare Other

## 2012-09-08 ENCOUNTER — Ambulatory Visit (INDEPENDENT_AMBULATORY_CARE_PROVIDER_SITE_OTHER): Payer: Medicare Other | Admitting: General Practice

## 2012-09-08 DIAGNOSIS — Z23 Encounter for immunization: Secondary | ICD-10-CM

## 2013-01-05 ENCOUNTER — Ambulatory Visit (INDEPENDENT_AMBULATORY_CARE_PROVIDER_SITE_OTHER): Payer: Medicare Other | Admitting: Internal Medicine

## 2013-01-05 ENCOUNTER — Other Ambulatory Visit (INDEPENDENT_AMBULATORY_CARE_PROVIDER_SITE_OTHER): Payer: Medicare Other

## 2013-01-05 ENCOUNTER — Encounter: Payer: Self-pay | Admitting: Internal Medicine

## 2013-01-05 VITALS — BP 108/64 | HR 67 | Resp 10 | Wt 117.0 lb

## 2013-01-05 DIAGNOSIS — R634 Abnormal weight loss: Secondary | ICD-10-CM

## 2013-01-05 DIAGNOSIS — M1990 Unspecified osteoarthritis, unspecified site: Secondary | ICD-10-CM

## 2013-01-05 LAB — HEPATIC FUNCTION PANEL
AST: 19 U/L (ref 0–37)
Alkaline Phosphatase: 155 U/L — ABNORMAL HIGH (ref 39–117)
Total Bilirubin: 0.6 mg/dL (ref 0.3–1.2)

## 2013-01-05 LAB — CBC WITH DIFFERENTIAL/PLATELET
Basophils Absolute: 0 10*3/uL (ref 0.0–0.1)
Eosinophils Absolute: 0.2 10*3/uL (ref 0.0–0.7)
HCT: 39.6 % (ref 36.0–46.0)
Hemoglobin: 13.3 g/dL (ref 12.0–15.0)
Lymphs Abs: 2.5 10*3/uL (ref 0.7–4.0)
MCHC: 33.7 g/dL (ref 30.0–36.0)
Neutro Abs: 3.9 10*3/uL (ref 1.4–7.7)
RDW: 14.7 % — ABNORMAL HIGH (ref 11.5–14.6)

## 2013-01-05 LAB — COMPREHENSIVE METABOLIC PANEL
AST: 19 U/L (ref 0–37)
BUN: 8 mg/dL (ref 6–23)
CO2: 29 mEq/L (ref 19–32)
Calcium: 9 mg/dL (ref 8.4–10.5)
Chloride: 96 mEq/L (ref 96–112)
Creatinine, Ser: 0.9 mg/dL (ref 0.4–1.2)
GFR: 66.41 mL/min (ref >60.00)
Glucose, Bld: 124 mg/dL — ABNORMAL HIGH (ref 70–99)

## 2013-01-05 NOTE — Progress Notes (Signed)
Subjective:    Patient ID: Diane [name], female    DOB: 1924-09-07, 79 y.o.   MRN: [id number]  HPI Diane [name] for routine follow up. Per Dtr she has lost 3 lbs in the last year; her level of activity has dropped. The patient does complain of hand pain due to arthritis. She admits to fatigue.  Past Medical History  Diagnosis Date  . Arthritis     worst:hands and shoulder  . GERD (gastroesophageal reflux disease)   . Glaucoma     s/p laser surgery  . Hyperlipidemia   . Heart murmur     MVR, AVS  . Migraine     resolved with menopause  . CHF (congestive heart failure), NYHA class IV     had been a hospice patient  . Cystitis     h/o frequent bouts  . Measles     childhood  . Varicella without complication     chilodhood  . Pneumonia 2004   Past Surgical History  Procedure Laterality Date  . Breast biopsy    . Appendectomy      '41  . Tonsillectomy and adenoidectomy  '47  . Cholecystectomy  1980's  . Abdominal hysterectomy      1969  . G4p2    . Shoulder arthroscopy      right   Family History  Problem Relation Age of Onset  . Alcohol abuse Father    History   Social History  . Marital Status: Widowed    Spouse Name: N/A    Number of Children: N/A  . Years of Education: 13   Occupational History  .     Social History Main Topics  . Smoking status: Never Smoker   . Smokeless tobacco: Never Used  . Alcohol Use: No  . Drug Use: No  . Sexually Active: Not on file   Other Topics Concern  . 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 1 son - '59, 1 dtr -'67; 4 grandchildren, 5 great-grands. Lives alone but looked in on. End of life care - DNR/DNI       Review of Systems System review is negative for any constitutional, cardiac, pulmonary, GI or neuro symptoms or complaints other than as described in the HPI.     Objective:   Physical Exam Filed Vitals:   01/05/13 1301  BP: 108/64  Pulse: 67  Resp: 10    Wt Readings from Last 3 Encounters:  01/05/13 117 lb (53.071 kg)  07/06/12 122 lb (55.339 kg)  04/26/12 123 lb (55.792 kg)   Gen'l - elderly white woman in no distress HEENT - C&S clear Cor - III/VI RSB, III/VI Apex, II/VI LSB, RRR, no JVD, no carotid bruits Pul - CTAP, w/o rales  Lab Results  Component Value Date   WBC 7.6 01/05/2013   HGB 13.3 01/05/2013   HCT 39.6 01/05/2013   PLT 308.0 01/05/2013   GLUCOSE 124* 01/05/2013   CHOL 220* 07/06/2012   TRIG 107.0 07/06/2012   HDL 68.40 07/06/2012   LDLDIRECT 135.6 07/06/2012        ALT 12 01/05/2013   AST 19 01/05/2013        NA 132* 01/05/2013   K 4.2 01/05/2013   CL 96 01/05/2013   CREATININE 0.9 01/05/2013   BUN 8 01/05/2013   CO2 29 01/05/2013   TSH 2.72 01/05/2013        Assessment & Plan:  Malaise and fatigue - no metabolic derangement. May be an additive effect of her chronic medical problems and advancing age. No need for further investigation at this time.

## 2013-01-05 NOTE — Patient Instructions (Addendum)
For an old gal you are looking pretty good.   For the stiff and sore hands: heat - soaking your hands in warm water, arthritic gloves, heated bead baths, etc. Tylenol 500 mg 4 times a day on schedule.  Nutritional status - You have lost some weight. You need to eat what you want but please include some healthy good. Will check labs for nutritional markers.  Heart murmurs do not sound any different and further testing is not needed.  Heart failure - your lungs are clear and there are no signs of heart failure. Please continue taking all your present medication.   You will get a letter with your lab results and any words of wisdom that are appropriate.  Have your family help you got a great website - "[email or web address]" for information on taking charge of your life and your care.  Thank you for coming to see me. Come back in 4-6 months, sooner if you need me.

## 2013-01-08 NOTE — Assessment & Plan Note (Signed)
Continued problem.  Recommend: heat, ROM, APAP as needed and for unrelieved pain - OTC NSAIDs, e.g aleve

## 2013-01-08 NOTE — Assessment & Plan Note (Signed)
No evidence of decompensated heart failure at today's visit.  Plan Continue medical therapy.

## 2013-01-08 NOTE — Assessment & Plan Note (Signed)
LDL cholesterol is just above goal and there is no need for medical intervention.

## 2013-02-15 ENCOUNTER — Other Ambulatory Visit: Payer: Self-pay | Admitting: Internal Medicine

## 2013-03-17 ENCOUNTER — Other Ambulatory Visit: Payer: Self-pay | Admitting: Internal Medicine

## 2013-05-31 ENCOUNTER — Encounter: Payer: Self-pay | Admitting: Internal Medicine

## 2013-05-31 ENCOUNTER — Ambulatory Visit (INDEPENDENT_AMBULATORY_CARE_PROVIDER_SITE_OTHER): Payer: Medicare Other | Admitting: Internal Medicine

## 2013-05-31 VITALS — BP 112/72 | HR 72 | Temp 98.4°F | Wt 113.0 lb

## 2013-05-31 DIAGNOSIS — R42 Dizziness and giddiness: Secondary | ICD-10-CM

## 2013-05-31 DIAGNOSIS — R410 Disorientation, unspecified: Secondary | ICD-10-CM

## 2013-05-31 DIAGNOSIS — F29 Unspecified psychosis not due to a substance or known physiological condition: Secondary | ICD-10-CM

## 2013-05-31 NOTE — Patient Instructions (Addendum)
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
## 2013-06-14 ENCOUNTER — Other Ambulatory Visit: Payer: Self-pay

## 2013-07-05 ENCOUNTER — Encounter: Payer: Self-pay | Admitting: Internal Medicine

## 2013-07-05 ENCOUNTER — Other Ambulatory Visit: Payer: Medicare Other

## 2013-07-05 ENCOUNTER — Ambulatory Visit (INDEPENDENT_AMBULATORY_CARE_PROVIDER_SITE_OTHER): Payer: Medicare Other | Admitting: Internal Medicine

## 2013-07-05 VITALS — BP 108/60 | HR 82 | Temp 95.1°F | Wt 113.6 lb

## 2013-07-05 DIAGNOSIS — R413 Other amnesia: Secondary | ICD-10-CM

## 2013-07-05 DIAGNOSIS — I509 Heart failure, unspecified: Secondary | ICD-10-CM

## 2013-07-05 DIAGNOSIS — I10 Essential (primary) hypertension: Secondary | ICD-10-CM

## 2013-07-05 NOTE — Progress Notes (Signed)
  Subjective:    Patient ID: Diane [name], female    DOB: February 06, 1924, 79 y.o.   MRN: [id number]  HPI Diane [name] presents for follow up. Her family is very concerned about her memory and ability to manage on her own. Diane [name] denies having any problems. She states that she feels well. Takes her medicines and eats meals. Her daughters report that she is not reliable. Her dementia is significant and help is provided by family. She states that she feels well. She was recently seen by Ms. Baity - note reviewed.  PMH, FamHx and SocHx reviewed for any changes and relevance.  Current Outpatient Prescriptions on File Prior to Visit  Medication Sig Dispense Refill  . aspirin EC 81 MG tablet Take 81 mg by mouth daily.        . Cranberry 1000 MG CAPS Take 1 each by mouth daily.      . furosemide (LASIX) 40 MG tablet Take 1 tablet (40 mg total) by mouth daily.  90 tablet  3  . KLOR-CON M20 20 MEQ tablet TAKE 1 TABLET DAILY  90 tablet  3  . Loratadine (CLARITIN) 10 MG CAPS Take by mouth.        . MULTIPLE VITAMIN PO Take by mouth.        Marland Kitchen omeprazole (PRILOSEC) 40 MG capsule TAKE 1 CAPSULE DAILY  90 capsule  3  . Probiotic Product (PROBIOTIC & ACIDOPHILUS EX ST PO) Take 1 each by mouth daily.      . propranolol (INDERAL) 20 MG tablet TAKE 1 TABLET TWICE A DAY  180 tablet  3   No current facility-administered medications on file prior to visit.      Review of Systems Negative review: cardiac, pul, GI, MSK.    Objective:   Physical Exam Filed Vitals:   07/05/13 1314  BP: 108/60  Pulse: 82  Temp: 95.1 F (35.1 C)   Wt Readings from Last 3 Encounters:  07/05/13 113 lb 9.6 oz (51.529 kg)  05/31/13 113 lb (51.256 kg)  01/05/13 117 lb (53.071 kg)   Gen'l - slender elderly white woman in no distress HEENT- C&S clear Cor 2+ radial , RRR Pulm - normal respirations, lungs - CTAP Neuro - awake and alert, speech is clear, HOH.       Assessment & Plan:

## 2013-07-05 NOTE — Patient Instructions (Addendum)
Good to see you. You are stubborn but that's ok.   YOU REALLY NEED TO CONSIDER CONGREGATE LIVING FOR SAFETY AND COMFORT!!!!  Your blood pressure is fine and your exam is normal.  We will check lab today per routine.  Please continue your present medications. Eat one main meal a day and have a light breakfast and a snack.  Do NOT work out in the mid-day sun until the fall.   Come back to see me in 6 months or sooner as needed.

## 2013-07-06 ENCOUNTER — Telehealth: Payer: Self-pay

## 2013-07-06 DIAGNOSIS — E875 Hyperkalemia: Secondary | ICD-10-CM

## 2013-07-06 LAB — COMPREHENSIVE METABOLIC PANEL
ALT: 14 U/L (ref 0–35)
CO2: 25 mEq/L (ref 19–32)
Creatinine, Ser: 0.9 mg/dL (ref 0.4–1.2)
GFR: 65.45 mL/min (ref >60.00)
Total Bilirubin: 1.3 mg/dL — ABNORMAL HIGH (ref 0.3–1.2)

## 2013-07-06 NOTE — Telephone Encounter (Signed)
Phone call to patient and spoke to her care giver that she needs to return tomorrow to have her potassium level rechecked. Dr Debby Bud also okayed her to have a flu shot, regular dose since high dose is not in.

## 2013-07-07 ENCOUNTER — Other Ambulatory Visit (INDEPENDENT_AMBULATORY_CARE_PROVIDER_SITE_OTHER): Payer: Medicare Other

## 2013-07-07 ENCOUNTER — Ambulatory Visit (INDEPENDENT_AMBULATORY_CARE_PROVIDER_SITE_OTHER): Payer: Medicare Other

## 2013-07-07 DIAGNOSIS — Z23 Encounter for immunization: Secondary | ICD-10-CM

## 2013-07-07 DIAGNOSIS — E875 Hyperkalemia: Secondary | ICD-10-CM

## 2013-07-07 NOTE — Assessment & Plan Note (Signed)
Appears stable with no signs of decompensation.

## 2013-07-07 NOTE — Assessment & Plan Note (Signed)
Progressive problems with memory/dementia although not totally incompetent. Spoke with patient, her daughter and another relative/friend. Best solution would be AL with memory unit. However, Diane [name] does not want to leave her home. She does own her own home and does not have long term care insurance.  Plan Continue with frequent family help  Consider and work on congregate living placement: it will be tough with a need to liquidate her assets.  (greater than 50% of 45 min  visit spent on education and counseling)

## 2013-07-13 ENCOUNTER — Encounter: Payer: Self-pay | Admitting: Internal Medicine

## 2013-07-13 ENCOUNTER — Ambulatory Visit (INDEPENDENT_AMBULATORY_CARE_PROVIDER_SITE_OTHER): Payer: Medicare Other | Admitting: Internal Medicine

## 2013-07-13 VITALS — BP 116/60 | HR 82 | Wt 114.0 lb

## 2013-07-13 DIAGNOSIS — J209 Acute bronchitis, unspecified: Secondary | ICD-10-CM

## 2013-07-13 MED ORDER — AZITHROMYCIN 250 MG PO TABS: ORAL_TABLET | ORAL | Status: DC

## 2013-07-13 NOTE — Patient Instructions (Addendum)
Acute bronchitis - no evidence of pneumonia.   Plan - Zpak as directed  Robitussin DM 1 tsp every 4 hours for cough  Hydrate - gator aide, ginger ale, etc.  Bland diet for a day or two: soups, broth, rice, potatoes, etc  Tylenol 325 mg 1 or 2 every 6 hours as needed for aches or pains.

## 2013-07-13 NOTE — Progress Notes (Signed)
  Subjective:    Patient ID: Diane [name], female    DOB: 06/28/1924, 79 y.o.   MRN: [id number]  HPI Patient presents for a 5 day illness - mostly bed bound. Reports no fever, chills, N/V/D, no pain. Mild shortness of breath. Cough productive of a thick, tenacious sputum, dark in color.   PMH, FamHx and SocHx reviewed for any changes and relevance.  Current Outpatient Prescriptions on File Prior to Visit  Medication Sig Dispense Refill  . aspirin EC 81 MG tablet Take 81 mg by mouth daily.        . Cranberry 1000 MG CAPS Take 1 each by mouth daily.      . furosemide (LASIX) 40 MG tablet Take 1 tablet (40 mg total) by mouth daily.  90 tablet  3  . KLOR-CON M20 20 MEQ tablet TAKE 1 TABLET DAILY  90 tablet  3  . Loratadine (CLARITIN) 10 MG CAPS Take by mouth.        . MULTIPLE VITAMIN PO Take by mouth.        Marland Kitchen omeprazole (PRILOSEC) 40 MG capsule TAKE 1 CAPSULE DAILY  90 capsule  3  . Probiotic Product (PROBIOTIC & ACIDOPHILUS EX ST PO) Take 1 each by mouth daily.      . propranolol (INDERAL) 20 MG tablet TAKE 1 TABLET TWICE A DAY  180 tablet  3   No current facility-administered medications on file prior to visit.      Review of Systems System review is negative for any constitutional, cardiac, pulmonary, GI or neuro symptoms or complaints other than as described in the HPI.     Objective:   Physical Exam Filed Vitals:   07/13/13 1557  BP: 116/60  Pulse: 82   gen'l - elderly thin white woman in no distress HEENT- C&S clear Neck- supple Cor 2+ radial, RRR II/VI mm best at RSB and Apex PUlm - no increased WOB, no wheezing, no rales.       Assessment & Plan:  Acute bronchitis - no evidence of pneumonia.   Plan - Zpak as directed  Robitussin DM 1 tsp every 4 hours for cough  Hydrate - gator aide, ginger ale, etc.  Bland diet for a day or two: soups, broth, rice, potatoes, etc  Tylenol 325 mg 1 or 2 every 6 hours as needed for aches or pains.

## 2013-09-05 ENCOUNTER — Other Ambulatory Visit: Payer: Self-pay | Admitting: Internal Medicine

## 2013-09-09 DIAGNOSIS — E860 Dehydration: Secondary | ICD-10-CM

## 2013-09-09 HISTORY — DX: Dehydration: E86.0

## 2013-09-26 ENCOUNTER — Telehealth: Payer: Self-pay | Admitting: *Deleted

## 2013-09-26 DIAGNOSIS — N390 Urinary tract infection, site not specified: Secondary | ICD-10-CM

## 2013-09-26 HISTORY — DX: Urinary tract infection, site not specified: N39.0

## 2013-09-26 NOTE — Telephone Encounter (Signed)
Spoke with Linda advised of MDs message. 

## 2013-09-26 NOTE — Telephone Encounter (Signed)
Diane [name] called requesting a review of pts medications.  states pt seems to be taking to much Lasix and pt is declining rapidly.  Further requests possible hospitalization for nutritional support.  Please advise

## 2013-09-26 NOTE — Telephone Encounter (Signed)
May reduce furosemide to every other day.  For any consideration of hospitalization will need office visit with family in attendance to discuss forced nutrition.

## 2013-09-27 ENCOUNTER — Inpatient Hospital Stay (HOSPITAL_COMMUNITY)
Admission: EM | Admit: 2013-09-27 | Discharge: 2013-10-13 | DRG: 689 | Disposition: A | Discharge status: Skilled Nursing Facility | Payer: Medicare Other | Attending: Internal Medicine | Admitting: Internal Medicine

## 2013-09-27 ENCOUNTER — Emergency Department (HOSPITAL_COMMUNITY): Payer: Medicare Other

## 2013-09-27 ENCOUNTER — Encounter (HOSPITAL_COMMUNITY): Payer: Self-pay | Admitting: Emergency Medicine

## 2013-09-27 DIAGNOSIS — N289 Disorder of kidney and ureter, unspecified: Secondary | ICD-10-CM

## 2013-09-27 DIAGNOSIS — I509 Heart failure, unspecified: Secondary | ICD-10-CM | POA: Diagnosis present

## 2013-09-27 DIAGNOSIS — I5033 Acute on chronic diastolic (congestive) heart failure: Secondary | ICD-10-CM | POA: Diagnosis not present

## 2013-09-27 DIAGNOSIS — Z7982 Long term (current) use of aspirin: Secondary | ICD-10-CM

## 2013-09-27 DIAGNOSIS — E8809 Other disorders of plasma-protein metabolism, not elsewhere classified: Secondary | ICD-10-CM | POA: Diagnosis present

## 2013-09-27 DIAGNOSIS — E860 Dehydration: Secondary | ICD-10-CM | POA: Diagnosis present

## 2013-09-27 DIAGNOSIS — I080 Rheumatic disorders of both mitral and aortic valves: Secondary | ICD-10-CM | POA: Diagnosis present

## 2013-09-27 DIAGNOSIS — J189 Pneumonia, unspecified organism: Secondary | ICD-10-CM | POA: Diagnosis present

## 2013-09-27 DIAGNOSIS — R011 Cardiac murmur, unspecified: Secondary | ICD-10-CM

## 2013-09-27 DIAGNOSIS — R918 Other nonspecific abnormal finding of lung field: Secondary | ICD-10-CM

## 2013-09-27 DIAGNOSIS — R413 Other amnesia: Secondary | ICD-10-CM

## 2013-09-27 DIAGNOSIS — M129 Arthropathy, unspecified: Secondary | ICD-10-CM | POA: Diagnosis present

## 2013-09-27 DIAGNOSIS — IMO0002 Reserved for concepts with insufficient information to code with codable children: Secondary | ICD-10-CM | POA: Diagnosis present

## 2013-09-27 DIAGNOSIS — E875 Hyperkalemia: Secondary | ICD-10-CM | POA: Diagnosis present

## 2013-09-27 DIAGNOSIS — E785 Hyperlipidemia, unspecified: Secondary | ICD-10-CM | POA: Diagnosis present

## 2013-09-27 DIAGNOSIS — I2789 Other specified pulmonary heart diseases: Secondary | ICD-10-CM | POA: Diagnosis present

## 2013-09-27 DIAGNOSIS — R339 Retention of urine, unspecified: Secondary | ICD-10-CM | POA: Diagnosis not present

## 2013-09-27 DIAGNOSIS — E46 Unspecified protein-calorie malnutrition: Secondary | ICD-10-CM | POA: Diagnosis present

## 2013-09-27 DIAGNOSIS — N390 Urinary tract infection, site not specified: Principal | ICD-10-CM | POA: Diagnosis present

## 2013-09-27 DIAGNOSIS — E872 Acidosis, unspecified: Secondary | ICD-10-CM | POA: Diagnosis present

## 2013-09-27 DIAGNOSIS — Z66 Do not resuscitate: Secondary | ICD-10-CM | POA: Diagnosis present

## 2013-09-27 DIAGNOSIS — K5900 Constipation, unspecified: Secondary | ICD-10-CM | POA: Diagnosis not present

## 2013-09-27 DIAGNOSIS — E871 Hypo-osmolality and hyponatremia: Secondary | ICD-10-CM | POA: Diagnosis present

## 2013-09-27 DIAGNOSIS — F0390 Unspecified dementia without behavioral disturbance: Secondary | ICD-10-CM | POA: Diagnosis present

## 2013-09-27 DIAGNOSIS — R627 Adult failure to thrive: Secondary | ICD-10-CM | POA: Diagnosis present

## 2013-09-27 DIAGNOSIS — K219 Gastro-esophageal reflux disease without esophagitis: Secondary | ICD-10-CM | POA: Diagnosis present

## 2013-09-27 DIAGNOSIS — N179 Acute kidney failure, unspecified: Secondary | ICD-10-CM | POA: Diagnosis not present

## 2013-09-27 DIAGNOSIS — I10 Essential (primary) hypertension: Secondary | ICD-10-CM | POA: Diagnosis present

## 2013-09-27 DIAGNOSIS — H9190 Unspecified hearing loss, unspecified ear: Secondary | ICD-10-CM | POA: Diagnosis present

## 2013-09-27 HISTORY — DX: Dehydration: E86.0

## 2013-09-27 HISTORY — DX: Urinary tract infection, site not specified: N39.0

## 2013-09-27 LAB — BASIC METABOLIC PANEL
BUN: 35 mg/dL — ABNORMAL HIGH (ref 6–23)
BUN: 36 mg/dL — ABNORMAL HIGH (ref 6–23)
BUN: 37 mg/dL — ABNORMAL HIGH (ref 6–23)
CO2: <7 mEq/L — CL (ref 19–32)
CO2: 12 mEq/L — ABNORMAL LOW (ref 19–32)
Calcium: 9.1 mg/dL (ref 8.4–10.5)
Calcium: 9.3 mg/dL (ref 8.4–10.5)
Calcium: 8.8 mg/dL (ref 8.4–10.5)
Calcium: 8.6 mg/dL (ref 8.4–10.5)
Chloride: 90 mEq/L — ABNORMAL LOW (ref 96–112)
Chloride: 92 mEq/L — ABNORMAL LOW (ref 96–112)
Chloride: 91 mEq/L — ABNORMAL LOW (ref 96–112)
Creatinine, Ser: 1.27 mg/dL — ABNORMAL HIGH (ref 0.50–1.10)
Creatinine, Ser: 1.26 mg/dL — ABNORMAL HIGH (ref 0.50–1.10)
Creatinine, Ser: 1.49 mg/dL — ABNORMAL HIGH (ref 0.50–1.10)
GFR calc non Af Amer: 37 mL/min — ABNORMAL LOW (ref >90)
GFR calc non Af Amer: 37 mL/min — ABNORMAL LOW (ref >90)
GFR calc non Af Amer: 30 mL/min — ABNORMAL LOW (ref >90)
Glucose, Bld: 106 mg/dL — ABNORMAL HIGH (ref 70–99)
Glucose, Bld: 55 mg/dL — ABNORMAL LOW (ref 70–99)
Glucose, Bld: 91 mg/dL (ref 70–99)
Glucose, Bld: 112 mg/dL — ABNORMAL HIGH (ref 70–99)
Potassium: 6.2 mEq/L — ABNORMAL HIGH (ref 3.5–5.1)
Potassium: 5.8 mEq/L — ABNORMAL HIGH (ref 3.5–5.1)

## 2013-09-27 LAB — HEPATIC FUNCTION PANEL
Albumin: 3.4 g/dL — ABNORMAL LOW (ref 3.5–5.2)
Alkaline Phosphatase: 215 U/L — ABNORMAL HIGH (ref 39–117)
Total Bilirubin: 2.3 mg/dL — ABNORMAL HIGH (ref 0.3–1.2)
Total Protein: 7.6 g/dL (ref 6.0–8.3)

## 2013-09-27 LAB — CBC
HCT: 38.8 % (ref 36.0–46.0)
Hemoglobin: 13.7 g/dL (ref 12.0–15.0)
MCH: 31.8 pg (ref 26.0–34.0)
MCHC: 35.3 g/dL (ref 30.0–36.0)
MCV: 90 fL (ref 78.0–100.0)
RDW: 15.2 % (ref 11.5–15.5)

## 2013-09-27 LAB — URINALYSIS, ROUTINE W REFLEX MICROSCOPIC
Glucose, UA: NEGATIVE mg/dL
Ketones, ur: NEGATIVE mg/dL
Protein, ur: NEGATIVE mg/dL
Specific Gravity, Urine: 1.015 (ref 1.005–1.030)
pH: 5 (ref 5.0–8.0)

## 2013-09-27 LAB — URINE MICROSCOPIC-ADD ON

## 2013-09-27 LAB — CK: Total CK: 145 U/L (ref 7–177)

## 2013-09-27 LAB — LIPASE, BLOOD: Lipase: 18 U/L (ref 11–59)

## 2013-09-27 MED ORDER — LORATADINE 10 MG PO CAPS: 10.0000 mg | ORAL_CAPSULE | Freq: Every day | ORAL | Status: DC

## 2013-09-27 MED ORDER — PANTOPRAZOLE SODIUM 40 MG PO TBEC
80.0000 mg | DELAYED_RELEASE_TABLET | Freq: Every day | ORAL | Status: DC
  Administered 2013-09-27 – 2013-10-13 (×17): 80 mg via ORAL
  Filled 2013-09-27 (×19): qty 2

## 2013-09-27 MED ORDER — DEXTROSE 5 % IV SOLN
1.0000 g | Freq: Once | INTRAVENOUS | Status: AC
  Administered 2013-09-27: 1 g via INTRAVENOUS
  Filled 2013-09-27: qty 10

## 2013-09-27 MED ORDER — INSULIN ASPART 100 UNIT/ML ~~LOC~~ SOLN
10.0000 [IU] | Freq: Once | SUBCUTANEOUS | Status: AC
  Administered 2013-09-27: 10 [IU] via INTRAVENOUS
  Filled 2013-09-27: qty 1

## 2013-09-27 MED ORDER — ACETAMINOPHEN 325 MG PO TABS
650.0000 mg | ORAL_TABLET | Freq: Four times a day (QID) | ORAL | Status: DC | PRN
  Administered 2013-09-27 – 2013-10-01 (×2): 650 mg via ORAL
  Filled 2013-09-27 (×2): qty 2

## 2013-09-27 MED ORDER — SODIUM CHLORIDE 0.9 % IV BOLUS (SEPSIS)
500.0000 mL | Freq: Once | INTRAVENOUS | Status: AC
  Administered 2013-09-27: 500 mL via INTRAVENOUS

## 2013-09-27 MED ORDER — LORATADINE 10 MG PO TABS
10.0000 mg | ORAL_TABLET | Freq: Every day | ORAL | Status: DC
  Administered 2013-09-27 – 2013-10-13 (×17): 10 mg via ORAL
  Filled 2013-09-27 (×18): qty 1

## 2013-09-27 MED ORDER — SODIUM POLYSTYRENE SULFONATE 15 GM/60ML PO SUSP
30.0000 g | Freq: Once | ORAL | Status: AC
  Administered 2013-09-27: 30 g via ORAL
  Filled 2013-09-27: qty 120

## 2013-09-27 MED ORDER — ADULT MULTIVITAMIN W/MINERALS CH
1.0000 | ORAL_TABLET | Freq: Every day | ORAL | Status: DC
  Administered 2013-09-27 – 2013-10-13 (×17): 1 via ORAL
  Filled 2013-09-27 (×17): qty 1

## 2013-09-27 MED ORDER — ONDANSETRON HCL 4 MG PO TABS
4.0000 mg | ORAL_TABLET | Freq: Four times a day (QID) | ORAL | Status: DC | PRN
  Administered 2013-09-29 – 2013-10-13 (×5): 4 mg via ORAL
  Filled 2013-09-27 (×5): qty 1

## 2013-09-27 MED ORDER — ACETAMINOPHEN 650 MG RE SUPP: 650.0000 mg | Freq: Four times a day (QID) | RECTAL | Status: DC | PRN

## 2013-09-27 MED ORDER — PROPRANOLOL HCL 20 MG PO TABS
20.0000 mg | ORAL_TABLET | Freq: Two times a day (BID) | ORAL | Status: DC
  Administered 2013-09-28 – 2013-10-12 (×18): 20 mg via ORAL
  Filled 2013-09-27 (×33): qty 1

## 2013-09-27 MED ORDER — ASPIRIN EC 81 MG PO TBEC
81.0000 mg | DELAYED_RELEASE_TABLET | Freq: Every day | ORAL | Status: DC
  Administered 2013-09-27 – 2013-10-13 (×17): 81 mg via ORAL
  Filled 2013-09-27 (×17): qty 1

## 2013-09-27 MED ORDER — SODIUM CHLORIDE 0.9 % IJ SOLN
3.0000 mL | Freq: Two times a day (BID) | INTRAMUSCULAR | Status: DC
  Administered 2013-09-29 – 2013-10-12 (×15): 3 mL via INTRAVENOUS

## 2013-09-27 MED ORDER — DEXTROSE 5 % IV SOLN
1.0000 g | INTRAVENOUS | Status: DC
  Administered 2013-09-28 – 2013-10-02 (×5): 1 g via INTRAVENOUS
  Filled 2013-09-27 (×6): qty 10

## 2013-09-27 MED ORDER — SODIUM BICARBONATE 8.4 % IV SOLN
INTRAVENOUS | Status: DC
  Administered 2013-09-28 – 2013-09-29 (×4): via INTRAVENOUS
  Filled 2013-09-27 (×8): qty 150

## 2013-09-27 MED ORDER — ONDANSETRON HCL 4 MG/2ML IJ SOLN
4.0000 mg | Freq: Once | INTRAMUSCULAR | Status: AC
  Administered 2013-09-27: 4 mg via INTRAVENOUS
  Filled 2013-09-27: qty 2

## 2013-09-27 MED ORDER — DOCUSATE SODIUM 100 MG PO CAPS
100.0000 mg | ORAL_CAPSULE | Freq: Every day | ORAL | Status: DC
  Administered 2013-09-27 – 2013-10-12 (×16): 100 mg via ORAL
  Filled 2013-09-27 (×17): qty 1

## 2013-09-27 MED ORDER — ALBUTEROL SULFATE (5 MG/ML) 0.5% IN NEBU: 2.5000 mg | INHALATION_SOLUTION | RESPIRATORY_TRACT | Status: DC | PRN

## 2013-09-27 MED ORDER — SODIUM CHLORIDE 0.9 % IV SOLN
INTRAVENOUS | Status: AC
  Administered 2013-09-27 (×2): via INTRAVENOUS

## 2013-09-27 MED ORDER — INSULIN ASPART 100 UNIT/ML IV SOLN
10.0000 [IU] | Freq: Once | INTRAVENOUS | Status: DC
  Filled 2013-09-27: qty 0.1

## 2013-09-27 MED ORDER — DEXTROSE 50 % IV SOLN
1.0000 | Freq: Once | INTRAVENOUS | Status: AC
  Administered 2013-09-27: 50 mL via INTRAVENOUS
  Filled 2013-09-27: qty 50

## 2013-09-27 MED ORDER — ONDANSETRON HCL 4 MG/2ML IJ SOLN
4.0000 mg | Freq: Four times a day (QID) | INTRAMUSCULAR | Status: DC | PRN
  Administered 2013-10-10: 4 mg via INTRAVENOUS
  Filled 2013-09-27: qty 2

## 2013-09-27 NOTE — ED Notes (Addendum)
Pt states that her hands and feet are numb. Extremities are cold to touch and nail beds are blue in color. MD aware

## 2013-09-27 NOTE — ED Notes (Signed)
Pt reports feeling nauseated. 

## 2013-09-27 NOTE — Progress Notes (Signed)
Pt admitted to the unit at 1801. Pt mental status is a&ox2. Pt oriented to room, staff, and call bell. Full assessment charted in [facility]. Call bell within reach. Visitor guidelines reviewed w/ pt and/or family.

## 2013-09-27 NOTE — Progress Notes (Signed)
CRITICAL VALUE ALERT  Critical value received:  CO2 < 7  Date of notification:  09/27/13  Time of notification:  2215  Critical value read back:yes  Nurse who received alert:  Marlaine Hind  MD notified (1st page):  Craige Cotta  Time of first page:  2219  MD notified (2nd page):  Time of second page:  Responding MD:  Craige Cotta  Time MD responded:  2235

## 2013-09-27 NOTE — ED Notes (Signed)
Per EMS pt from home with c/o increase in weakness this last week. Daugthers also report increase in dementia. Pt has lost appetite, generally weak, nausea/vomiting. VSS. CBG 130. IV 24G RFA.

## 2013-09-27 NOTE — H&P (Addendum)
[facility]  History and Physical  RAELEEN WINSTANLEY ZOX:[id number] DOB: Oct 12, 1924 DOA: 09/27/2013  Referring physician: EDP PCP: [name], MD  Outpatient Specialists:  1. Cardiology: Dr. Harrold Donath Pickering/  Chief Complaint: Nausea and generalized weakness  HPI: Diane [name] is a 79 y.o. female with history of dementia, GERD, hyperlipidemia, recurrent cystitis, chronic diastolic CHF, discharge from hospice couple of years ago, hard of hearing, currently lives alone and has people coming home to cook and clean for her, presented to the ED with history of nausea and weakness. Patient is unable to provide any history secondary to altered mental status. History is provided by her family at bedside. According to family, patient has been declining over the last 2-3 years but more so in the last couple of months. Over the last week, she's been having persistent nausea but without vomiting, abdominal pain, fever or chills. No history of constipation of diarrhea. Some spotting of blood noted on her underwear today. No reported dysuria or urinary frequency-in the past she used to promptly alert family when she had UTI symptoms. Complained of some dyspnea yesterday but none since. Some leg edema. Appetite has been poor. She has been progressively more confused and sometimes forgets to the door or leave cooking range on. No history of falls. Week to an extent where she only can more from her bed to her recliner. In the ED, sodium 129, potassium 6.8, bicarbonate 12, creatinine 1.27, AST 101, ALT 55, WBC 12.9, UA + and EKG without changes of hyperkalemia. Hospitalist admission requested.   Review of Systems: All systems reviewed and apart from history of presenting illness, are negative.  Past Medical History  Diagnosis Date  . Arthritis     worst:hands and shoulder  . GERD (gastroesophageal reflux disease)   . Glaucoma     s/p laser surgery  . Hyperlipidemia   . Heart murmur     MVR, AVS  .  Migraine     resolved with menopause  . CHF (congestive heart failure), NYHA class IV     had been a hospice patient  . Cystitis     h/o frequent bouts  . Measles     childhood  . Varicella without complication     chilodhood  . Pneumonia 2004   Past Surgical History  Procedure Laterality Date  . Breast biopsy    . Appendectomy      '41  . Tonsillectomy and adenoidectomy  '47  . Cholecystectomy  1980's  . Abdominal hysterectomy      1969  . G4p2    . Shoulder arthroscopy      right   Social History:  reports that she has never smoked. She has never used smokeless tobacco. She reports that she does not drink alcohol or use illicit drugs. Widowed. Lives alone. Has some help at home.  Allergies  Allergen Reactions  . Alendronate Sodium   . Biapenem   . Evista [Raloxifene Hydrochloride]   . Lansoprazole   . Levofloxacin   . Lipitor [Atorvastatin Calcium]   . Lyrica [Pregabalin]   . Miacalcin   . Prednisone   . Pyridium [Phenazopyridine Hcl]   . Statins Other (See Comments)    Cardiac reaction  . Sulfa Drugs Cross Reactors Nausea Only  . Welchol [Colesevelam Hcl]   . Zetia [Ezetimibe]     Cardiac reaction  . Penicillins Rash    Family History  Problem Relation Age of Onset  . Alcohol abuse Father  Prior to Admission medications   Medication Sig Start Date End Date Taking? Authorizing Provider  aspirin EC 81 MG tablet Take 81 mg by mouth daily.     Yes Historical Provider, MD  Cranberry 1000 MG CAPS Take 1,000 mg by mouth daily.    Yes Historical Provider, MD  docusate sodium (COLACE) 100 MG capsule Take 100 mg by mouth at bedtime.   Yes Historical Provider, MD  furosemide (LASIX) 40 MG tablet Take 40 mg by mouth every other day.   Yes Historical Provider, MD  Loratadine (CLARITIN) 10 MG CAPS Take 10 mg by mouth daily.    Yes Historical Provider, MD  Multiple Vitamin (MULTIVITAMIN WITH MINERALS) TABS tablet Take 1 tablet by mouth daily.   Yes Historical  Provider, MD  omeprazole (PRILOSEC) 40 MG capsule Take 40 mg by mouth daily.   Yes Historical Provider, MD  potassium chloride SA (K-DUR,KLOR-CON) 20 MEQ tablet Take 20 mEq by mouth every other day.   Yes Historical Provider, MD  Probiotic Product (PROBIOTIC & ACIDOPHILUS EX ST PO) Take 1 tablet by mouth daily.    Yes Historical Provider, MD  propranolol (INDERAL) 20 MG tablet Take 20 mg by mouth 2 (two) times daily.   Yes Historical Provider, MD   Physical Exam: Filed Vitals:   09/27/13 1300 09/27/13 1358 09/27/13 1401 09/27/13 1419  BP: 122/90  115/45   Pulse:   66 67  Temp:      TempSrc:      Resp: 27  26 31   SpO2:  100% 93% 100%     General exam: Small built and frail elderly woman lying comfortably supine on the gurney.  Head, eyes and ENT: Nontraumatic and normocephalic. Pupils equally reacting to light and accommodation. Oral mucosa dry. Edentulous.  Neck: Supple. No JVD, carotid bruit or thyromegaly.  Lymphatics: No lymphadenopathy.  Respiratory system: Slightly diminished breath sounds in the bases but rest of lung fields are clear to auscultation. No crackles, wheezing or rhonchi appreciated. No increased work of breathing.  Cardiovascular system: S1 and S2 heard, RRR. No JVD, murmurs, gallops, clicks. Trace bilateral ankle edema.  Gastrointestinal system: Abdomen is nondistended, soft and nontender. Normal bowel sounds heard. No organomegaly or masses appreciated.  Central nervous system: Alert and oriented times person and place. No focal neurological deficits. Hard of hearing.  Extremities: Symmetric 5 x 5 power. Peripheral pulses symmetrically felt. Extremities feet cold but without cyanosis.? Reticular rash on bilateral knees without acute findings.  Skin: No rashes or acute findings.  Musculoskeletal system: Negative exam.  Psychiatry: Pleasant and cooperative.   Labs on Admission:  Basic Metabolic Panel:  Recent Labs Lab 09/27/13 1138 09/27/13 1508   NA 126* 129*  K 6.2* 6.8*  CL 89* 90*  CO2 14* 12*  GLUCOSE 106* 55*  BUN 35* 36*  CREATININE 1.26* 1.27*  CALCIUM 9.1 9.3   Liver Function Tests:  Recent Labs Lab 09/27/13 1241  AST 101*  ALT 55*  ALKPHOS 215*  BILITOT 2.3*  PROT 7.6  ALBUMIN 3.4*    Recent Labs Lab 09/27/13 1241  LIPASE 18   No results found for this basename: AMMONIA,  in the last 168 hours CBC:  Recent Labs Lab 09/27/13 1138  WBC 12.9*  HGB 13.7  HCT 38.8  MCV 90.0  PLT 237   Cardiac Enzymes: No results found for this basename: CKTOTAL, CKMB, CKMBINDEX, TROPONINI,  in the last 168 hours  BNP (last 3 results) No results found for this  basename: PROBNP,  in the last 8760 hours CBG:  Recent Labs Lab 09/27/13 1225  GLUCAP 79    Radiological Exams on Admission: Dg Chest Port 1 View  09/27/2013   CLINICAL DATA:  Fatigue  EXAM: PORTABLE CHEST - 1 VIEW  COMPARISON:  10/31/2010  FINDINGS: 1338 hr. The cardio pericardial silhouette is enlarged. Interstitial markings are diffusely coarsened with chronic features. There is patchy alveolar opacity in the right base. Bones are diffusely demineralized. Telemetry leads overlie the chest.  IMPRESSION: Cardiomegaly with underlying chronic interstitial coarsening.  Patchy alveolar opacity in the right base. This could be related to atelectasis or pneumonia.   Electronically Signed   By: Kennith Center M.D.   On: 09/27/2013 13:48    EKG: Independently reviewed. Sinus rhythm without acute changes or changes of hyperkalemia.  Assessment/Plan Principal Problem:   UTI (lower urinary tract infection) Active Problems:   GERD (gastroesophageal reflux disease)   CHF (congestive heart failure), NYHA class IV   Essential hypertension, benign   Hyperkalemia   Dehydration with hyponatremia   Failure to thrive   DNR (do not resuscitate)   UTI/recurrent - Continue IV Rocephin pending urine culture results  Dehydration with hyponatremia/nausea without  vomiting/GERD - Likely secondary to poor oral intake from ongoing nausea. Nausea could be secondary to UTI, metabolic abnormalities, gastritis or medications. - Brief IV fluids - Continue PPI. - Dysphagia 3 diet. According to family, patient's dentures don't work well.  Acute renal failure - Secondary to dehydration, poor oral intake and diuretics  Hyperkalemia/AG metabolic acidosis-anion gap 27 - Likely secondary to dehydration and acute renal failure.  - Treat with insulin, dextrose and Kayexalate. Continue hydrating with IV fluids and follow BMP in a couple of hours. - Monitor on telemetry. - Home Lasix and potassium supplements are obviously held. - Check lactate.  Hypertension - Continue home beta blockers. Reasonably controlled.  Chronic diastolic CHF - Clinically dehydrated. Continue brief IV fluids for now and hold Lasix.  Failure to thrive/dementia - As per family, nursing home placement has been recommended in the past by patient's PCP but they were able to provide additional care. However they state that her physical and mental status has progressively declined and she is not safe to live by herself any longer. They are unable to provide 24/7 care. They would like her to be placed at the nursing facility. Social worker consultation placed. - PT and OT evaluation.    Code Status: DO NOT RESUSCITATE-confirmed with family   Family Communication: Discussed with patient's son, daughter and daughter-in-law at bedside.   Disposition Plan: SNF when medically stable   Time spent: 65 minutes   Sultan Pargas, MD, FACP, FHM. [facility] Pager [phone]  If 7PM-7AM, please contact night-coverage [email or web address] Password [facility] 09/27/2013, 4:39 PM

## 2013-09-27 NOTE — ED Notes (Signed)
MD at bedside. 

## 2013-09-27 NOTE — ED Notes (Addendum)
Pt placed on bedpan

## 2013-09-27 NOTE — ED Notes (Signed)
Pt received of NS with EMS

## 2013-09-27 NOTE — ED Provider Notes (Signed)
CSN: [id number]     Arrival date & time 09/27/13  1056 History   First MD Initiated Contact with Patient 09/27/13 1221     Chief Complaint  Patient presents with  . Weakness  . Emesis   level V caveat due to difficulty hearing (Consider location/radiation/quality/duration/timing/severity/associated sxs/prior Treatment) Patient is a 79 y.o. female presenting with weakness and vomiting. The history is provided by the patient.  Weakness This is a new problem.  Emesis  patient has been doing poorly for the last week. She's been eating less. She's been unable to get out of bed. She's not eaten in 2 days per family members. She was on hospice several years ago and is continued to be a DO NOT RESUSCITATE. Patient is complaining of nausea  Past Medical History  Diagnosis Date  . Arthritis     worst:hands and shoulder  . GERD (gastroesophageal reflux disease)   . Glaucoma     s/p laser surgery  . Hyperlipidemia   . Heart murmur     MVR, AVS  . Migraine     resolved with menopause  . CHF (congestive heart failure), NYHA class IV     had been a hospice patient  . Cystitis     h/o frequent bouts  . Measles     childhood  . Varicella without complication     chilodhood  . Pneumonia 2004   Past Surgical History  Procedure Laterality Date  . Breast biopsy    . Appendectomy      '41  . Tonsillectomy and adenoidectomy  '47  . Cholecystectomy  1980's  . Abdominal hysterectomy      1969  . G4p2    . Shoulder arthroscopy      right   Family History  Problem Relation Age of Onset  . Alcohol abuse Father    History  Substance Use Topics  . Smoking status: Never Smoker   . Smokeless tobacco: Never Used  . Alcohol Use: No   OB History   Grav Para Term Preterm Abortions TAB SAB Ect Mult Living                 Review of Systems  Unable to perform ROS Gastrointestinal: Positive for vomiting.  Neurological: Positive for weakness.    Allergies  Alendronate sodium;  Biapenem; Evista; Lansoprazole; Levofloxacin; Lipitor; Lyrica; Miacalcin; Prednisone; Pyridium; Statins; Sulfa drugs cross reactors; Welchol; Zetia; and Penicillins  Home Medications   Current Outpatient Rx  Name  Route  Sig  Dispense  Refill  . aspirin EC 81 MG tablet   Oral   Take 81 mg by mouth daily.           . Cranberry 1000 MG CAPS   Oral   Take 1,000 mg by mouth daily.          Marland Kitchen docusate sodium (COLACE) 100 MG capsule   Oral   Take 100 mg by mouth at bedtime.         . furosemide (LASIX) 40 MG tablet   Oral   Take 40 mg by mouth every other day.         . Loratadine (CLARITIN) 10 MG CAPS   Oral   Take 10 mg by mouth daily.          . Multiple Vitamin (MULTIVITAMIN WITH MINERALS) TABS tablet   Oral   Take 1 tablet by mouth daily.         Marland Kitchen omeprazole (PRILOSEC) 40 MG  capsule   Oral   Take 40 mg by mouth daily.         . potassium chloride SA (K-DUR,KLOR-CON) 20 MEQ tablet   Oral   Take 20 mEq by mouth every other day.         . Probiotic Product (PROBIOTIC & ACIDOPHILUS EX ST PO)   Oral   Take 1 tablet by mouth daily.          . propranolol (INDERAL) 20 MG tablet   Oral   Take 20 mg by mouth 2 (two) times daily.          BP 115/45  Pulse 67  Temp(Src) 97.4 F (36.3 C) (Oral)  Resp 31  SpO2 100% Physical Exam  Constitutional: She appears well-developed.  HENT:  Head: Normocephalic.  Eyes: Pupils are equal, round, and reactive to light.  Cardiovascular: Normal rate.   Pulmonary/Chest:  Tachypnea  Abdominal: There is no tenderness.  Musculoskeletal: She exhibits no edema.  Neurological: She is alert.  Difficulty understanding. May be due to difficulty hearing  Skin: No erythema.    ED Course  Procedures (including critical care time) Labs Review Labs Reviewed  CBC - Abnormal; Notable for the following:    WBC 12.9 (*)    All other components within normal limits  BASIC METABOLIC PANEL - Abnormal; Notable for the  following:    Sodium 126 (*)    Potassium 6.2 (*)    Chloride 89 (*)    CO2 14 (*)    Glucose, Bld 106 (*)    BUN 35 (*)    Creatinine, Ser 1.26 (*)    GFR calc non Af Amer 37 (*)    GFR calc Af Amer 43 (*)    All other components within normal limits  HEPATIC FUNCTION PANEL - Abnormal; Notable for the following:    Albumin 3.4 (*)    AST 101 (*)    ALT 55 (*)    Alkaline Phosphatase 215 (*)    Total Bilirubin 2.3 (*)    Bilirubin, Direct 1.0 (*)    Indirect Bilirubin 1.3 (*)    All other components within normal limits  URINALYSIS, ROUTINE W REFLEX MICROSCOPIC - Abnormal; Notable for the following:    Color, Urine AMBER (*)    APPearance CLOUDY (*)    Hgb urine dipstick TRACE (*)    Bilirubin Urine SMALL (*)    Leukocytes, UA MODERATE (*)    All other components within normal limits  URINE MICROSCOPIC-ADD ON - Abnormal; Notable for the following:    Bacteria, UA MANY (*)    All other components within normal limits  URINE CULTURE  GLUCOSE, CAPILLARY  LIPASE, BLOOD  BASIC METABOLIC PANEL   Imaging Review Dg Chest Port 1 View  09/27/2013   CLINICAL DATA:  Fatigue  EXAM: PORTABLE CHEST - 1 VIEW  COMPARISON:  10/31/2010  FINDINGS: 1338 hr. The cardio pericardial silhouette is enlarged. Interstitial markings are diffusely coarsened with chronic features. There is patchy alveolar opacity in the right base. Bones are diffusely demineralized. Telemetry leads overlie the chest.  IMPRESSION: Cardiomegaly with underlying chronic interstitial coarsening.  Patchy alveolar opacity in the right base. This could be related to atelectasis or pneumonia.   Electronically Signed   By: Kennith Center M.D.   On: 09/27/2013 13:48    EKG Interpretation    Date/Time:  Wednesday September 27 2013 11:42:44 EST Ventricular Rate:  74 PR Interval:  203 QRS Duration: 109 QT Interval:  431 QTC Calculation: 478 R Axis:   106 Text Interpretation:  Sinus rhythm Atrial premature complexes Probable right  ventricular hypertrophy Confirmed by Conard Alvira  MD, Milina Pagett ([id number]) on 09/27/2013 2:08:04 PM            MDM   1. Dehydration   2. UTI (urinary tract infection)   3. Renal insufficiency   4. Hyperkalemia    Patient with failure to thrive and nausea. Likely dehydrated. Potassium is mildly elevated. Will repeat since it may be hemolysis. Bicarbonate is low. She has likely urinary tract infection. Pneumonia felt less likely that she's not been coughing. Will admit to internal medicine.   Juliet Rude. Rubin Payor, MD 09/27/13 1546

## 2013-09-28 ENCOUNTER — Inpatient Hospital Stay (HOSPITAL_COMMUNITY): Payer: Medicare Other

## 2013-09-28 ENCOUNTER — Encounter (HOSPITAL_COMMUNITY): Payer: Self-pay | Admitting: General Practice

## 2013-09-28 DIAGNOSIS — E860 Dehydration: Secondary | ICD-10-CM

## 2013-09-28 DIAGNOSIS — I509 Heart failure, unspecified: Secondary | ICD-10-CM

## 2013-09-28 DIAGNOSIS — Z66 Do not resuscitate: Secondary | ICD-10-CM

## 2013-09-28 DIAGNOSIS — R918 Other nonspecific abnormal finding of lung field: Secondary | ICD-10-CM

## 2013-09-28 DIAGNOSIS — I129 Hypertensive chronic kidney disease with stage 1 through stage 4 chronic kidney disease, or unspecified chronic kidney disease: Secondary | ICD-10-CM

## 2013-09-28 DIAGNOSIS — N179 Acute kidney failure, unspecified: Secondary | ICD-10-CM

## 2013-09-28 DIAGNOSIS — E871 Hypo-osmolality and hyponatremia: Secondary | ICD-10-CM

## 2013-09-28 DIAGNOSIS — E875 Hyperkalemia: Secondary | ICD-10-CM

## 2013-09-28 DIAGNOSIS — N390 Urinary tract infection, site not specified: Principal | ICD-10-CM

## 2013-09-28 LAB — CBC
HCT: 39.5 % (ref 36.0–46.0)
Hemoglobin: 13.4 g/dL (ref 12.0–15.0)
MCH: 31.3 pg (ref 26.0–34.0)
MCV: 92.3 fL (ref 78.0–100.0)
Platelets: 158 10*3/uL (ref 150–400)
RBC: 4.28 MIL/uL (ref 3.87–5.11)
WBC: 26.8 10*3/uL — ABNORMAL HIGH (ref 4.0–10.5)

## 2013-09-28 LAB — BASIC METABOLIC PANEL
BUN: 40 mg/dL — ABNORMAL HIGH (ref 6–23)
BUN: 42 mg/dL — ABNORMAL HIGH (ref 6–23)
BUN: 43 mg/dL — ABNORMAL HIGH (ref 6–23)
CO2: 10 mEq/L — CL (ref 19–32)
Calcium: 8.8 mg/dL (ref 8.4–10.5)
Calcium: 8.1 mg/dL — ABNORMAL LOW (ref 8.4–10.5)
Calcium: 7.9 mg/dL — ABNORMAL LOW (ref 8.4–10.5)
Chloride: 92 mEq/L — ABNORMAL LOW (ref 96–112)
Chloride: 91 mEq/L — ABNORMAL LOW (ref 96–112)
Creatinine, Ser: 1.55 mg/dL — ABNORMAL HIGH (ref 0.50–1.10)
Creatinine, Ser: 1.41 mg/dL — ABNORMAL HIGH (ref 0.50–1.10)
GFR calc Af Amer: 34 mL/min — ABNORMAL LOW (ref >90)
GFR calc non Af Amer: 30 mL/min — ABNORMAL LOW (ref >90)
GFR calc non Af Amer: 29 mL/min — ABNORMAL LOW (ref >90)
Glucose, Bld: 132 mg/dL — ABNORMAL HIGH (ref 70–99)
Glucose, Bld: 165 mg/dL — ABNORMAL HIGH (ref 70–99)
Glucose, Bld: 112 mg/dL — ABNORMAL HIGH (ref 70–99)
Potassium: 4.8 mEq/L (ref 3.5–5.1)
Potassium: 3.6 mEq/L (ref 3.5–5.1)
Sodium: 127 mEq/L — ABNORMAL LOW (ref 135–145)
Sodium: 127 mEq/L — ABNORMAL LOW (ref 135–145)

## 2013-09-28 LAB — CLOSTRIDIUM DIFFICILE BY PCR: Toxigenic C. Difficile by PCR: NEGATIVE

## 2013-09-28 MED ORDER — SODIUM POLYSTYRENE SULFONATE 15 GM/60ML PO SUSP
15.0000 g | Freq: Once | ORAL | Status: AC
  Administered 2013-09-28: 07:00:00 15 g via ORAL
  Filled 2013-09-28: qty 60

## 2013-09-28 MED ORDER — SODIUM CHLORIDE 0.9 % IV SOLN
INTRAVENOUS | Status: DC
  Administered 2013-09-28 – 2013-09-29 (×2): via INTRAVENOUS

## 2013-09-28 NOTE — Evaluation (Signed)
  Physical Therapy Evaluation Patient Details Name: Diane [name] MRN: [id number] DOB: September 06, 1924 Today's Date: 09/28/2013 Time: 7846-9629 PT Time Calculation (min): 36 min  PT Assessment / Plan / Recommendation History of Present Illness  Diane [name] admitted with acute UTI, acute renal insufficiency, metabolic acidosis requiring bicarb qtt, hyponatremia.   Clinical Impression  Patient lives alone and does not have adequate support to safely discharge home due to decreased balance and decreased safety.  Family is unable to provide 24 hour S.  Continue to recommend skilled nursing facility for post-acute therapy needs.     PT Assessment  Patient needs continued PT services    Follow Up Recommendations  SNF    Does the patient have the potential to tolerate intense rehabilitation      Barriers to Discharge        Equipment Recommendations  None recommended by PT    Recommendations for Other Services     Frequency Min 3X/week    Precautions / Restrictions Precautions Precautions: Fall   Pertinent Vitals/Pain No reports of pain.      Mobility  Bed Mobility Bed Mobility: Supine to Sit Supine to Sit: HOB elevated;With rails;4: Min guard Transfers Transfers: Sit to Stand;Stand to Sit Sit to Stand: 4: Min assist;3: Mod assist;From toilet;From chair/3-in-1 Stand to Sit: 4: Min assist;To toilet;To chair/3-in-1 Details for Transfer Assistance: poorly controlled descent.  MOD A from toilet due to not having high seat on it due to urgency. Ambulation/Gait Ambulation/Gait Assistance: 4: Min assist Ambulation Distance (Feet): 20 Feet Assistive device: Rolling walker Ambulation/Gait Assistance Details: Pt incontinent of urine with gait. Gait Pattern: Step-to pattern;Decreased step length - left;Decreased step length - right Gait velocity: slow    Exercises     PT Diagnosis: Difficulty walking  PT Problem List: Decreased activity tolerance;Decreased balance;Decreased  mobility;Decreased knowledge of use of DME PT Treatment Interventions: Gait training;Functional mobility training;Therapeutic activities;Therapeutic exercise;Patient/family education;Balance training     PT Goals(Current goals can be found in the care plan section) Acute Rehab PT Goals Patient Stated Goal: Family would like SNF PT Goal Formulation: With patient/family Time For Goal Achievement: 10/12/13 Potential to Achieve Goals: Good  Visit Information  Last PT Received On: 09/28/13 Assistance Needed: +1 History of Present Illness: Diane [name] admitted with acute UTI, acute renal insufficiency, metabolic acidosis requiring bicarb qtt, hyponatremia.        Prior Functioning  Home Living Family/patient expects to be discharged to:: Skilled nursing facility Living Arrangements: Alone Prior Function Level of Independence: Independent Comments: Daughter reports she was very unsteady Communication Communication: HOH    Cognition  Cognition Arousal/Alertness: Awake/alert Behavior During Therapy: WFL for tasks assessed/performed Overall Cognitive Status: Within Functional Limits for tasks assessed    Extremity/Trunk Assessment Lower Extremity Assessment Lower Extremity Assessment: Overall WFL for tasks assessed Cervical / Trunk Assessment Cervical / Trunk Assessment: Normal   Balance Balance Balance Assessed: Yes Static Standing Balance Static Standing - Balance Support: Bilateral upper extremity supported (while PT assisted with peri-care) Static Standing - Level of Assistance: 4: Min assist  End of Session PT - End of Session Equipment Utilized During Treatment: Gait belt Activity Tolerance: Patient limited by fatigue Patient left: in chair;with family/visitor present;with chair alarm set Nurse Communication: Mobility status  GP     Ariyah Sedlack LUBECK 09/28/2013, 11:14 AM

## 2013-09-28 NOTE — Clinical Social Work Placement (Signed)
Clinical Social Work Department CLINICAL SOCIAL WORK PLACEMENT NOTE 09/28/2013  Patient:  Diane [name], Diane [name]  Account Number:  [id number] Admit date:  09/27/2013  Clinical Social Worker:  Cherre Blanc, [place]  Date/time:  09/28/2013 01:00 PM  Clinical Social Work is seeking post-discharge placement for this patient at the following level of care:   SKILLED NURSING   (*CSW will update this form in Epic as items are completed)   09/28/2013  Patient/family provided with [facility] Department of Clinical Social Work's list of facilities offering this level of care within the geographic area requested by the patient (or if unable, by the patient's family).  09/28/2013  Patient/family informed of their freedom to choose among providers that offer the needed level of care, that participate in Medicare, Medicaid or managed care program needed by the patient, have an available bed and are willing to accept the patient.  09/28/2013  Patient/family informed of [facility]' ownership interest in [facility], as well as of the fact that they are under no obligation to receive care at this facility.  PASARR submitted to EDS on  PASARR number received from EDS on   FL2 transmitted to all facilities in geographic area requested by pt/family on  09/28/2013 FL2 transmitted to all facilities within larger geographic area on   Patient informed that his/her managed care company has contracts with or will negotiate with  certain facilities, including the following:     Patient/family informed of bed offers received:   Patient chooses bed at  Physician recommends and patient chooses bed at    Patient to be transferred to  on   Patient to be transferred to facility by   The following physician request were entered in Epic:   Additional Comments:   Roddie Mc, [place], [place], [phone]

## 2013-09-28 NOTE — Progress Notes (Signed)
CRITICAL VALUE ALERT  Critical value received:  CO2 10  Date of notification:  09/28/13  Time of notification:  0533  Critical value read back:yes  Nurse who received alert:  Marlaine Hind  MD notified (1st page):  Craige Cotta  Time of first page:  0534  MD notified (2nd page):  Time of second page:  Responding MD:  Craige Cotta  Time MD responded:  [phone]

## 2013-09-28 NOTE — Clinical Social Work Psychosocial (Signed)
Clinical Social Work Department BRIEF PSYCHOSOCIAL ASSESSMENT 09/28/2013  Patient:  Diane [name], Diane [name]     Account Number:  [id number]     Admit date:  09/27/2013  Clinical Social Worker:  Lavell Luster  Date/Time:  09/28/2013 10:30 AM  Referred by:  Physician  Date Referred:  09/28/2013 Referred for  SNF Placement   Other Referral:   Interview type:  Family Other interview type:   Patient and daughter interviewed for assessment.    PSYCHOSOCIAL DATA Living Status:  ALONE Admitted from facility:   Level of care:   Primary support name:  Lupita Leash [phone] or Rosanne Ashing [phone], [id number] Primary support relationship to patient:  CHILD, ADULT Degree of support available:   Support is strong.    CURRENT CONCERNS Current Concerns  Post-Acute Placement   Other Concerns:    SOCIAL WORK ASSESSMENT / PLAN CSW met with patient and daughter at bedside to discuss recommendation for SNF placement and the placement process. Patient is agreeable to SNF placement and would prefer [facility]. Patient has had past stay with [facility]. Patient is from home alone. Patient has two supportive children, Rosanne Ashing and Lupita Leash.   Assessment/plan status:  Psychosocial Support/Ongoing Assessment of Needs Other assessment/ plan:   Complete FL2, Fax, PASRR   Information/referral to community resources:   CSW contact information and SNF list given to patient and daughter.    PATIENT'S/FAMILY'S RESPONSE TO PLAN OF CARE: Patient and daughter are agreeable to SNF placement. Patient and daughter were pleasant and appreciative of CSW contact. Patient and daughter await bed offers.     Roddie Mc, [place], [place], [phone]

## 2013-09-28 NOTE — Progress Notes (Signed)
Brief HPI: Diane [name] is a 79 yo WF with multiple chronic medical problems admitted by [facility] (for Dr. Debby Bud, her PCP) with UTI, AKI, and metabolic acidosis thought to be secondary to AKI and dehydration.  Tonight, this NP has been following pt closely given her labs/acidosis. At beginning of shift, pt had BMP drawn around 7pm that didn't result to RN until 1030pm. By that time, pt had received kayexelate, D50 and Novolog for the high K and acidosis. Therefore, the result of CO2 of 7 was questioned given time lapse and a stat BMP was repeated. Lactic acid was 12.9. Stat BMP showed CO2 up to 12 with anion gap of 23. Given low bicarb and continued anion gap, a bicarb gtt was started around midnight and her NS IVFs were increased slightly. Discussed with Dr. Onalee Hua of [facility]. Another BMP was drawn around 3am which showed a CO2 of 10 and gap of 25 with a K of 5.9. NP to bedside at that time.  S: pt says she feels "just fine" and has no complaints. She denies chest pain, SOB, dizziness. RN says she has mentated well all shift and has not appeared clinically unstable.  O: VS reviewed. BP is a tad soft, but stable, and pt is not symptomatic. She appears well. Alert, appropriate in speech and actions. CARD: RRR, no JVD. RESP: normal effort. No crackles on exam.  A/P: 1. Metabolic acidosis secondary to AKI/dehydration-increase bicarb gtt to 100cc/hr (was 75). Cont NS at 75cc/hr. Watch for signs of fluid overload. Continue to follow BMP, gap and K. Another 15gm of Kayexalate ordered for K of 5.9 this am.  2. Hx CHF-as per #1. No signs of fluid overload on exam.  3. UTI-on abx. Given her non toxic physical appearance and hemodynamic stability, will leave on floor for now. Care to be turned over to Dr. Debby Bud her PCP this am.  Jimmye Norman, NP [facility]

## 2013-09-28 NOTE — Progress Notes (Signed)
Subjective: Diane [name] admitted with acute UTI, acute renal insufficiency, metabolic acidosis requiring bicarb qtt, hyponatremia. Admit note reviewed, NP care note reviewed - patient has received Kayexalate, insulin and vigorous IVF in addition to bicarb qtt. She is on Rocephin IV for infection.  She is awake, oriented to person and examiner. Denies having had any SOB or cough, no dysuria, denies any pain at this time.   Objective: Lab:  Recent Labs  09/27/13 1138 09/28/13 0245  WBC 12.9* 26.8*  HGB 13.7 13.4  HCT 38.8 39.5  MCV 90.0 92.3  PLT 237 158    Recent Labs  09/27/13 1920 09/27/13 2245 09/28/13 0245  NA 128* 126* 127*  K 5.5* 5.8* 5.9*  CL 92* 91* 92*  GLUCOSE 91 112* 132*  BUN 37* 39* 40*  CREATININE 1.26* 1.49* 1.49*  CALCIUM 8.8 8.6 8.8   U/A  7-10 WBC, many bacteria Imaging: CXR 11/19 1345 hr: IMPRESSION:  Cardiomegaly with underlying chronic interstitial coarsening.  Patchy alveolar opacity in the right base. This could be related to  atelectasis or pneumonia.  Scheduled Meds: . aspirin EC  81 mg Oral Daily  . cefTRIAXone (ROCEPHIN)  IV  1 g Intravenous Q24H  . docusate sodium  100 mg Oral QHS  . loratadine  10 mg Oral Daily  . multivitamin with minerals  1 tablet Oral Daily  . pantoprazole  80 mg Oral Daily  . propranolol  20 mg Oral BID  . sodium chloride  3 mL Intravenous Q12H   Continuous Infusions: . sodium chloride 75 mL/hr at 09/28/13 0003  .  sodium bicarbonate  infusion 1000 mL 100 mL/hr at 09/28/13 0542   PRN Meds:.acetaminophen, acetaminophen, albuterol, ondansetron (ZOFRAN) IV, ondansetron   Physical Exam: Filed Vitals:   09/28/13 0629  BP: 96/63  Pulse: 78  Temp: 97.3  Resp: 18    Intake/Output Summary (Last 24 hours) at 09/28/13 0800 Last data filed at 09/28/13 0236  Gross per 24 hour  Intake    650 ml  Output      0 ml  Net    650 ml   Gen'l - little "apple" woman in no distress HEENT- normal Cor - 2+ radial,  frequent PVCs, II/VI hammer like murmur at axillary line, II/vI mm at RSB Pulm - no increased WOB, no rales or wheezing Abd - soft, BS + Neuro - awake and alert.        Assessment/Plan: 1. ID - mildly abnormal U/a with a disproportionate leukocytosis. CXR is abnormal but no cough. Decreased BS on exam. Question of pneumonia in addition to UTI. Lactic acid is high.  Plan Continue Rocephin  Add Azithromycin for atypical respiratory   F/u CBC  2. Metabolic - marked derangement due to acute illness with metabolic acidosis, hyperkalemia, hyponatremia. She has been started on bicarb qtt and is being hydrated  Plan Continue present therapy  Bmet 1600 hrs  3. ARI - moderate elevation Cr.  Plan Continue hydration  Follow up Bmet  4. Cardiac - h/o diastolic dysfunction. Last Echo 12/ 23/ 11: y Conclusions  - Left ventricle: Wall thickness was increased in a pattern of mild LVH. Systolic function was vigorous. The estimated ejection fraction was in the range of 65% to 70%. Wall motion was normal; there were no regional wall motion abnormalities. There was a reduced contribution of atrial contraction to ventricular filling, due to increased ventricular diastolic pressure or atrial contractile dysfunction. Doppler parameters are consistent with elevated mean left atrial filling  pressure. - Mitral valve: Calcified posteriorannulus. Mildly thickened leaflets . There was a possible, small, spherical, mobile vegetation on the left ventricular aspect of the tip of the anterior leaflet. In conjunction with MR, vegetation cannot be excluded. Moderate to severe regurgitation originating from the posteromedial commissure and directed eccentrically and toward the septum. - Aortic valve: Calcification. Moderate regurgitation. - Right ventricle: The cavity size was mildly dilated. Color doppler imaging demonstrates a systolic jet of flow from the area of the aortic cusp / LVOT directed towards  the RVOT. - Left atrium: The atrium was moderately dilated. - Right atrium: The atrium was moderately dilated. - Pulmonary arteries: PA peak pressure: 53-15mm Hg (S). Impressions:  - Moderate multiple valve disease including Aortic and Mitral Regurgitation with possible MV chordal rupture and vegetation associated with congestive heart failure, LA enlargement, pulmonary hypertension, and RA enlargement. Cannot rule out mitral valve vegetaion. The right ventricular systolic pressure was increased consistent with moderate-severe pulmonary hypertension most likely due to MR and LV diastolic dysfunction.  No signs of fluid overload.  Plan Close monitoring for fluid overload  2 D echo  5. Code status - confirmed chart record: DNR/DNI  Dispo - SNF when stable.    [name] [place] [facility] (o) [phone]; (c) [phone] Call-grp - [name] IM  Tele: [phone]  09/28/2013, 7:57 AM

## 2013-09-29 DIAGNOSIS — I059 Rheumatic mitral valve disease, unspecified: Secondary | ICD-10-CM

## 2013-09-29 LAB — CBC
HCT: 32.1 % — ABNORMAL LOW (ref 36.0–46.0)
MCH: 31 pg (ref 26.0–34.0)
MCHC: 34.9 g/dL (ref 30.0–36.0)
MCV: 88.9 fL (ref 78.0–100.0)
RDW: 15.7 % — ABNORMAL HIGH (ref 11.5–15.5)

## 2013-09-29 LAB — BASIC METABOLIC PANEL
BUN: 38 mg/dL — ABNORMAL HIGH (ref 6–23)
CO2: 27 mEq/L (ref 19–32)
Calcium: 7.6 mg/dL — ABNORMAL LOW (ref 8.4–10.5)
Chloride: 88 mEq/L — ABNORMAL LOW (ref 96–112)
Creatinine, Ser: 1.02 mg/dL (ref 0.50–1.10)
Glucose, Bld: 105 mg/dL — ABNORMAL HIGH (ref 70–99)
Sodium: 130 mEq/L — ABNORMAL LOW (ref 135–145)

## 2013-09-29 LAB — URINE CULTURE: Colony Count: >=100000

## 2013-09-29 MED ORDER — AZITHROMYCIN 250 MG PO TABS
250.0000 mg | ORAL_TABLET | Freq: Every day | ORAL | Status: AC
  Administered 2013-09-30 – 2013-10-03 (×4): 250 mg via ORAL
  Filled 2013-09-29 (×4): qty 1

## 2013-09-29 MED ORDER — ENOXAPARIN SODIUM 30 MG/0.3ML ~~LOC~~ SOLN
30.0000 mg | SUBCUTANEOUS | Status: DC
  Administered 2013-09-29 – 2013-10-08 (×10): 30 mg via SUBCUTANEOUS
  Filled 2013-09-29 (×10): qty 0.3

## 2013-09-29 MED ORDER — AZITHROMYCIN 500 MG PO TABS
500.0000 mg | ORAL_TABLET | Freq: Every day | ORAL | Status: AC
  Administered 2013-09-29: 500 mg via ORAL
  Filled 2013-09-29: qty 1

## 2013-09-29 MED ORDER — ENSURE COMPLETE PO LIQD
237.0000 mL | Freq: Two times a day (BID) | ORAL | Status: DC
  Administered 2013-09-29 – 2013-10-06 (×12): 237 mL via ORAL

## 2013-09-29 NOTE — Progress Notes (Signed)
INITIAL NUTRITION ASSESSMENT  DOCUMENTATION CODES Per approved criteria  -Not Applicable   INTERVENTION: Add Ensure Complete po BID, each supplement provides 350 kcal and 13 grams of protein. RD to continue to follow nutrition care plan.  NUTRITION DIAGNOSIS: Inadequate oral intake related to dementia as evidenced by variable appetite and H&P.   Goal: Intake to meet >90% of estimated nutrition needs.  Monitor:  weight trends, lab trends, I/O's, PO intake, supplement tolerance  Reason for Assessment: Malnutrition Screening Tool  79 y.o. female  Admitting Dx: UTI (lower urinary tract infection)  ASSESSMENT: PMHx significant for dementia, GERD, HLD, recurrent cystitis, CHF, discharge from hospice a couple of years ago. Admitted with nausea, fevers/chills and abdominal pain. Work-up reveals UTI.  Per H&P, pt currently lives alone and has people coming to cook and clean for her. Family reports that she has been declining over the last 2-3 years ago. Family reports that patient's dentures don't work well. Family now interested in pursuing SNF placement. Pt reports that she eats well at home - either has eggs or cereal for breakfast, has a sandwich for lunch, eats what she considers "a well balanced diet."   Sodium is low at 130.  Pt is at nutrition risk given her dementia and ongoing chronic medical issues. Pt with no obvious signs of muscle/fat mass loss at this time.  Height: Ht Readings from Last 1 Encounters:  09/27/13 5\' 2"  (1.575 m)    Weight: Wt Readings from Last 1 Encounters:  09/29/13 139 lb 12.8 oz (63.413 kg)  Admit wt 113lb  Ideal Body Weight: 110 lb  % Ideal Body Weight: 103%  Wt Readings from Last 10 Encounters:  09/29/13 139 lb 12.8 oz (63.413 kg)  07/13/13 114 lb (51.71 kg)  07/05/13 113 lb 9.6 oz (51.529 kg)  05/31/13 113 lb (51.256 kg)  01/05/13 117 lb (53.071 kg)  07/06/12 122 lb (55.339 kg)  04/26/12 123 lb (55.792 kg)  01/07/12 123 lb 12 oz  (56.133 kg)  09/14/11 124 lb (56.246 kg)  07/08/11 123 lb (55.792 kg)    Usual Body Weight: 115 lb  % Usual Body Weight: 98%  BMI:  Body mass index is 25.56 kg/(m^2). Overweight  Estimated Nutritional Needs: Kcal: 1500 - 1700 Protein: 60 - 70 g Fluid: 1.5 - 1.8 liters  Skin: intact  Diet Order: Dysphagia 3 with thin liquids  EDUCATION NEEDS: -No education needs identified at this time   Intake/Output Summary (Last 24 hours) at 09/29/13 0859 Last data filed at 09/29/13 0700  Gross per 24 hour  Intake 3083.75 ml  Output      0 ml  Net 3083.75 ml    Last BM: 11/20  Labs:   Recent Labs Lab 09/28/13 0658 09/28/13 1800 09/29/13 0548  NA 127* 130* 130*  K 4.8 3.6 3.5  CL 92* 91* 88*  CO2 14* 26 27  BUN 42* 43* 38*  CREATININE 1.55* 1.41* 1.02  CALCIUM 8.1* 7.9* 7.6*  GLUCOSE 165* 112* 105*    CBG (last 3)   Recent Labs  09/27/13 1225 09/27/13 1728  GLUCAP 79 71    Scheduled Meds: . aspirin EC  81 mg Oral Daily  . cefTRIAXone (ROCEPHIN)  IV  1 g Intravenous Q24H  . docusate sodium  100 mg Oral QHS  . loratadine  10 mg Oral Daily  . multivitamin with minerals  1 tablet Oral Daily  . pantoprazole  80 mg Oral Daily  . propranolol  20 mg Oral BID  .  sodium chloride  3 mL Intravenous Q12H    Continuous Infusions: . sodium chloride 75 mL/hr at 09/29/13 0319  .  sodium bicarbonate  infusion 1000 mL 100 mL/hr at 09/29/13 0201    Past Medical History  Diagnosis Date  . Arthritis     worst:hands and shoulder  . GERD (gastroesophageal reflux disease)   . Glaucoma     s/p laser surgery  . Hyperlipidemia   . Heart murmur     MVR, AVS  . Migraine     resolved with menopause  . CHF (congestive heart failure), NYHA class IV     had been a hospice patient  . Cystitis     h/o frequent bouts  . Measles     childhood  . Varicella without complication     chilodhood  . Pneumonia 2004  . UTI (urinary tract infection) 09/26/2013  . Dehydration  09/2013    Past Surgical History  Procedure Laterality Date  . Breast biopsy    . Appendectomy      '41  . Tonsillectomy and adenoidectomy  '47  . Cholecystectomy  1980's  . Abdominal hysterectomy      1969  . G4p2    . Shoulder arthroscopy      right    Jarold Motto MS, RD, LDN Pager: [phone] After-hours pager: [phone]

## 2013-09-29 NOTE — Clinical Social Work Placement (Signed)
Clinical Social Work Department CLINICAL SOCIAL WORK PLACEMENT NOTE 09/29/2013  Patient:  [name],Diane R  Account Number:  [id number] Admit date:  09/27/2013  Clinical Social Worker:  Aniya Jolicoeur BRYANT Kainalu Heggs, LCSWA  Date/time:  09/28/2013 01:00 PM  Clinical Social Work is seeking post-discharge placement for this patient at the following level of care:   SKILLED NURSING   (*CSW will update this form in Epic as items are completed)   09/28/2013  Patient/family provided with [facility] Department of Clinical Social Work's list of facilities offering this level of care within the geographic area requested by the patient (or if unable, by the patient's family).  09/28/2013  Patient/family informed of their freedom to choose among providers that offer the needed level of care, that participate in Medicare, Medicaid or managed care program needed by the patient, have an available bed and are willing to accept the patient.  09/28/2013  Patient/family informed of [facility]' ownership interest in [facility], as well as of the fact that they are under no obligation to receive care at this facility.  PASARR submitted to EDS on  PASARR number received from EDS on   FL2 transmitted to all facilities in geographic area requested by pt/family on  09/28/2013 FL2 transmitted to all facilities within larger geographic area on   Patient informed that his/her managed care company has contracts with or will negotiate with  certain facilities, including the following:     Patient/family informed of bed offers received:  09/29/2013 Patient chooses bed at  Physician recommends and patient chooses bed at    Patient to be transferred to  on   Patient to be transferred to facility by   The following physician request were entered in Epic:   Additional Comments:   [name], LCSWA, LCASA, [phone] 

## 2013-09-29 NOTE — Progress Notes (Signed)
Subjective: Diane [name] reports that she feels weak and tired but has no focal c/o.   Objective: Lab:  Recent Labs  09/27/13 1138 09/28/13 0245  WBC 12.9* 26.8*  HGB 13.7 13.4  HCT 38.8 39.5  MCV 90.0 92.3  PLT 237 158    Recent Labs  09/28/13 0658 09/28/13 1800 09/29/13 0548  NA 127* 130* 130*  K 4.8 3.6 3.5  CL 92* 91* 88*  GLUCOSE 165* 112* 105*  BUN 42* 43* 38*  CREATININE 1.55* 1.41* 1.02  CALCIUM 8.1* 7.9* 7.6*   C. Diff - negative Urince Cx - E. Coli - ss ceftriaxone  Imaging: pCXR erect - IMPRESSION:  1. The appearance of the chest suggests mild congestive heart  failure, as above.  2. Ill-defined opacity in the right lower lung may represent  developing area of bronchopneumonia in either the right middle or  lower lobe, or could indicate sequela of aspiration. Subsegmental  atelectasis is also possible, but is less favored.  3. Atherosclerosis.  Scheduled Meds: . aspirin EC  81 mg Oral Daily  . cefTRIAXone (ROCEPHIN)  IV  1 g Intravenous Q24H  . docusate sodium  100 mg Oral QHS  . loratadine  10 mg Oral Daily  . multivitamin with minerals  1 tablet Oral Daily  . pantoprazole  80 mg Oral Daily  . propranolol  20 mg Oral BID  . sodium chloride  3 mL Intravenous Q12H   Continuous Infusions: . sodium chloride 75 mL/hr at 09/29/13 0319  .  sodium bicarbonate  infusion 1000 mL 100 mL/hr at 09/29/13 0201   PRN Meds:.acetaminophen, acetaminophen, albuterol, ondansetron (ZOFRAN) IV, ondansetron   Physical Exam: Filed Vitals:   09/29/13 0728  BP: 104/69  Pulse: 76  Temp:   Resp: 17  Gen'l - elderly woman in no acute distress HEENT- C&S clear Cor- RRR Pulm - no increased WOB, no rales or wheezes Abd- soft Neuro - awake and alert      Assessment/Plan: 1. ID - bump in leukocytosis but not febrile. U Cx - e. Coli ss to rocephin. X-ray suggestive of PNA. Day # 3 rocephin, #1 Azithromycin.  Plan Continue present antibiotics  CBC in AM  2.  Metabolic - sodium improved, CO2 normal, Creatinine improved  Plan D/c bicarb qtt  IV to saline lock  Bmet in AM  3. ARI - improved  4. Cardiac - 2 D echo pending. No clinical signs of failure but x-ray looks a little wet.   Plan D/c tele   Diane [name] IM (o) [phone]; (c) [phone] Call-grp - Diane [name] IM  Tele: [phone]  09/29/2013, 8:55 AM

## 2013-09-29 NOTE — Progress Notes (Signed)
Echo Lab  2D Echocardiogram completed.  Ryman Rathgeber L Jovan Schickling, RDCS 09/29/2013 11:23 AM

## 2013-09-30 ENCOUNTER — Inpatient Hospital Stay (HOSPITAL_COMMUNITY): Payer: Medicare Other

## 2013-09-30 LAB — BASIC METABOLIC PANEL
BUN: 43 mg/dL — ABNORMAL HIGH (ref 6–23)
Creatinine, Ser: 1.23 mg/dL — ABNORMAL HIGH (ref 0.50–1.10)
GFR calc Af Amer: 44 mL/min — ABNORMAL LOW (ref >90)
GFR calc non Af Amer: 38 mL/min — ABNORMAL LOW (ref >90)
Glucose, Bld: 89 mg/dL (ref 70–99)
Sodium: 128 mEq/L — ABNORMAL LOW (ref 135–145)

## 2013-09-30 LAB — TROPONIN I
Troponin I: <0.3 ng/mL (ref <0.30)
Troponin I: <0.3 ng/mL (ref <0.30)

## 2013-09-30 LAB — CBC
Hemoglobin: 11.5 g/dL — ABNORMAL LOW (ref 12.0–15.0)
MCH: 31 pg (ref 26.0–34.0)
MCHC: 34.5 g/dL (ref 30.0–36.0)
MCV: 89.8 fL (ref 78.0–100.0)
RBC: 3.71 MIL/uL — ABNORMAL LOW (ref 3.87–5.11)
RDW: 15.6 % — ABNORMAL HIGH (ref 11.5–15.5)

## 2013-09-30 LAB — PRO B NATRIURETIC PEPTIDE: Pro B Natriuretic peptide (BNP): 18848 pg/mL — ABNORMAL HIGH (ref 0–450)

## 2013-09-30 MED ORDER — FUROSEMIDE 10 MG/ML IJ SOLN
40.0000 mg | Freq: Four times a day (QID) | INTRAMUSCULAR | Status: AC
  Administered 2013-09-30 – 2013-10-01 (×3): 40 mg via INTRAVENOUS
  Filled 2013-09-30 (×3): qty 4

## 2013-09-30 NOTE — Progress Notes (Signed)
Subjective: Diane [name] c/o not feeling well - weak but no focal c/o and no pain.  Objective: Lab:  Recent Labs  09/28/13 0245 09/29/13 2000 09/30/13 0510  WBC 26.8* 13.5* 13.6*  HGB 13.4 11.2* 11.5*  HCT 39.5 32.1* 33.3*  MCV 92.3 88.9 89.8  PLT 158 166 83*    Recent Labs  09/28/13 1800 09/29/13 0548 09/30/13 0510  NA 130* 130* 128*  K 3.6 3.5 3.3*  CL 91* 88* 85*  GLUCOSE 112* 105* 89  BUN 43* 38* 43*  CREATININE 1.41* 1.02 1.23*  CALCIUM 7.9* 7.6* 8.1*    Imaging: 2D echo 09/29/13: *[place]*  Study Conclusions  - Left ventricle: The cavity size was normal. Wall thickness was normal. Systolic function was normal. The estimated ejection fraction was in the range of 60% to 65%. Wall motion was normal; there were no regional wall motion abnormalities. Features are consistent with a pseudonormal left ventricular filling pattern, with concomitant abnormal relaxation and increased filling pressure (grade 2 diastolic dysfunction). Doppler parameters are consistent with high ventricular filling pressure. - Aortic valve: Moderate regurgitation. - Mitral valve: Calcified annulus. Severe regurgitation. - Left atrium: The atrium was moderately dilated. - Right ventricle: The cavity size was mildly dilated. Systolic pressure was increased. - Right atrium: The atrium was mildly dilated. - Tricuspid valve: Moderate regurgitation. - Pulmonary arteries: PA peak pressure: 75mm Hg (S).  Scheduled Meds: . aspirin EC  81 mg Oral Daily  . azithromycin  250 mg Oral Daily  . cefTRIAXone (ROCEPHIN)  IV  1 g Intravenous Q24H  . docusate sodium  100 mg Oral QHS  . enoxaparin (LOVENOX) injection  30 mg Subcutaneous Q24H  . feeding supplement (ENSURE COMPLETE)  237 mL Oral BID BM  . loratadine  10 mg Oral Daily  . multivitamin with minerals  1 tablet Oral Daily  . pantoprazole  80 mg Oral Daily  . propranolol  20 mg Oral BID  . sodium chloride  3 mL Intravenous Q12H    Continuous Infusions:  PRN Meds:.acetaminophen, acetaminophen, albuterol, ondansetron (ZOFRAN) IV, ondansetron   Physical Exam: Filed Vitals:   09/30/13 0408  BP: 97/57  Pulse: 84  Temp: 94.7 F (34.8 C)  Resp: 18  Total I/) this Adm +3,774 ml Gen'l- Elderly woman in no acute distress HEENT- irregular pupils (chronic) Neck w/o JVD Cor - RRR II/VI mm best at apex Pulm - no increased WOB, bibasilar rales worse left base, no wheezing Abd - soft, BS+ Neuro - drowsy, speech is clear, cognition is appropriate.     Assessment/Plan: 1. ID - leukocytosis is improving. #4 rocephin #2 azithromycin Plan Continue antibiotics  CBC in AM  2. Metabolic- CO2 normal but creatinine is up.Suspect poor po intake Will need diuresis - expect this will further stress creatinine. Plan BMet in AM  3. ARI - increasing creatinine as IV fluids reduced Plan Encouraged PO fluid intake  F/u Bmet  4. Cardiac - 2 D echo reveals grade 2 diastolic dysfunction, Severe mitral regurgitation. Exam with bibasilar rales in setting of positive fluid balance. Dtr reports [name] did c/o heart pain last night. Plan1 PBNP, cardiac panel q 8 x 3  IV lasix 40 mg IV q6 c 3 doses  F/u Bmet  Called and gave an update to Diane [name], dtr/POA. She asked about Hospice - at this point she is not a hospice candidate but will keep this option open.    Casimiro Needle Sydnie Sigmund [place] [facility] ([phone]; [phone] Call-grp -  Andrey Farmer  Tele: [phone]  09/30/2013, 9:41 AM

## 2013-09-30 NOTE — Progress Notes (Signed)
Pt c/o SOB and chest tightness.  Upon ascultation, lung sounds clear anterior.  Crackles noted posteriorly.  Pulse ox 94% on room air.  Bladder scan showed about 600cc urine retention.  Notified Dr. Pete Glatter.  Orders to in and out cath and get portable chest xray.

## 2013-10-01 LAB — CBC
Hemoglobin: 12.2 g/dL (ref 12.0–15.0)
MCH: 30.7 pg (ref 26.0–34.0)
MCHC: 34.4 g/dL (ref 30.0–36.0)
MCV: 89.4 fL (ref 78.0–100.0)
RBC: 3.97 MIL/uL (ref 3.87–5.11)
WBC: 13.9 10*3/uL — ABNORMAL HIGH (ref 4.0–10.5)

## 2013-10-01 LAB — BASIC METABOLIC PANEL
BUN: 49 mg/dL — ABNORMAL HIGH (ref 6–23)
CO2: 28 mEq/L (ref 19–32)
Calcium: 8.2 mg/dL — ABNORMAL LOW (ref 8.4–10.5)
Chloride: 85 mEq/L — ABNORMAL LOW (ref 96–112)
GFR calc non Af Amer: 37 mL/min — ABNORMAL LOW (ref >90)
Glucose, Bld: 96 mg/dL (ref 70–99)
Potassium: 3.3 mEq/L — ABNORMAL LOW (ref 3.5–5.1)
Sodium: 126 mEq/L — ABNORMAL LOW (ref 135–145)

## 2013-10-01 LAB — TROPONIN I: Troponin I: <0.3 ng/mL (ref <0.30)

## 2013-10-01 MED ORDER — FUROSEMIDE 10 MG/ML IJ SOLN
40.0000 mg | Freq: Four times a day (QID) | INTRAMUSCULAR | Status: AC
  Administered 2013-10-01 (×3): 40 mg via INTRAVENOUS
  Filled 2013-10-01 (×3): qty 4

## 2013-10-01 MED ORDER — TAMSULOSIN HCL 0.4 MG PO CAPS
0.4000 mg | ORAL_CAPSULE | Freq: Every day | ORAL | Status: DC
  Administered 2013-10-01 – 2013-10-13 (×13): 0.4 mg via ORAL
  Filled 2013-10-01 (×14): qty 1

## 2013-10-01 MED ORDER — POTASSIUM CHLORIDE CRYS ER 20 MEQ PO TBCR
40.0000 meq | EXTENDED_RELEASE_TABLET | Freq: Two times a day (BID) | ORAL | Status: AC
  Administered 2013-10-01 (×2): 40 meq via ORAL
  Filled 2013-10-01 (×2): qty 2

## 2013-10-01 NOTE — Progress Notes (Signed)
Patient had received Lasix IV and was unable to void, bladder scanned and recorded 500.  Call placed to physician with the order to insert foley and leave it.  Will continue to monitor.  Macarthur Critchley, RN

## 2013-10-01 NOTE — Progress Notes (Signed)
Subjective: RN notes reviewed - patient with urinary retention requiring placement of foley catheter. Has had chest tightness.  [name] states she is feeling better. She is in no distress  Objective: Lab:  Recent Labs  09/29/13 2000 09/30/13 0510 10/01/13 0601  WBC 13.5* 13.6* 13.9*  HGB 11.2* 11.5* 12.2  HCT 32.1* 33.3* 35.5*  MCV 88.9 89.8 89.4  PLT 166 83* 83*    Recent Labs  09/29/13 0548 09/30/13 0510 10/01/13 0601  NA 130* 128* 126*  K 3.5 3.3* 3.3*  CL 88* 85* 85*  GLUCOSE 105* 89 96  BUN 38* 43* 49*  CREATININE 1.02 1.23* 1.26*  CALCIUM 7.6* 8.1* 8.2*   Troponin <0.30 x 3; BNP  18840  Imaging: CXR 09/30/13: IMPRESSION:  Consolidation of bilateral lungs suspicious for pneumonia. Probable  small bilateral pleural effusions. Mild congestive heart failure.  Scheduled Meds: . aspirin EC  81 mg Oral Daily  . azithromycin  250 mg Oral Daily  . cefTRIAXone (ROCEPHIN)  IV  1 g Intravenous Q24H  . docusate sodium  100 mg Oral QHS  . enoxaparin (LOVENOX) injection  30 mg Subcutaneous Q24H  . feeding supplement (ENSURE COMPLETE)  237 mL Oral BID BM  . loratadine  10 mg Oral Daily  . multivitamin with minerals  1 tablet Oral Daily  . pantoprazole  80 mg Oral Daily  . propranolol  20 mg Oral BID  . sodium chloride  3 mL Intravenous Q12H   Continuous Infusions:  PRN Meds:.acetaminophen, acetaminophen, albuterol, ondansetron (ZOFRAN) IV, ondansetron   Physical Exam: Filed Vitals:   10/01/13 0454  BP: 113/71  Pulse: 85  Temp: 97.4 F (36.3 C)  Resp: 18    Intake/Output Summary (Last 24 hours) at 10/01/13 1012 Last data filed at 09/30/13 1800  Gross per 24 hour  Intake    110 ml  Output    600 ml  Net   -490 ml   Total this Adm: + 3,248 Wt 130lb 4 oz; 11/21 139lb 1 oz  Gen'l - elderly woman in no distress Cor - 2+ radial , RRR Pulm - improved breath sounds, feint rales Abd- soft Neuro - A&O   Assessment/Plan: 1. DI - resolving UTI and PNA  (CAP) #5 Rocephin, #3 azithromycin. WBC stable.  2. Metabolic - sodium remains low. K low Plan PO potassium replacement  Bmet in AM  Encourage PO fluids  3. ARI - with diuresis Cr up slightly (1.23 to 1.26) Plan F/u Bmet  4. Cardiac - clinically improved. Moderate diuresis after 3 doses IV furosemide. BNP elevated. Plan Furosemide IV x 3  BMet   5. GU - urinary retention. Plan flomax 0.4 mg daily   [name] [facility] (o) [phone]; (c) [phone] Call-grp - [name] IM  Tele: [phone]  10/01/2013, 10:10 AM

## 2013-10-02 ENCOUNTER — Inpatient Hospital Stay (HOSPITAL_COMMUNITY): Payer: Medicare Other

## 2013-10-02 LAB — BASIC METABOLIC PANEL
BUN: 49 mg/dL — ABNORMAL HIGH (ref 6–23)
CO2: 28 mEq/L (ref 19–32)
Calcium: 8.2 mg/dL — ABNORMAL LOW (ref 8.4–10.5)
Creatinine, Ser: 1.27 mg/dL — ABNORMAL HIGH (ref 0.50–1.10)
GFR calc non Af Amer: 37 mL/min — ABNORMAL LOW (ref >90)
Glucose, Bld: 103 mg/dL — ABNORMAL HIGH (ref 70–99)
Sodium: 125 mEq/L — ABNORMAL LOW (ref 135–145)

## 2013-10-02 MED ORDER — FUROSEMIDE 40 MG PO TABS
40.0000 mg | ORAL_TABLET | Freq: Every day | ORAL | Status: DC
  Administered 2013-10-02 – 2013-10-06 (×5): 40 mg via ORAL
  Filled 2013-10-02 (×5): qty 1

## 2013-10-02 MED ORDER — SODIUM CHLORIDE 0.9 % IV SOLN
INTRAVENOUS | Status: DC
  Administered 2013-10-02 – 2013-10-03 (×2): via INTRAVENOUS

## 2013-10-02 NOTE — Progress Notes (Signed)
OT Cancellation Note  Patient Details Name: Diane [name] MRN: [id number] DOB: 29-Jul-1924   Cancelled Treatment:    Reason Eval/Treat Not Completed:  Pt with plans for SNF. Will defer OT eval to SNF.  Evern Bio 10/02/2013, 8:01 AM

## 2013-10-02 NOTE — Progress Notes (Signed)
Subjective: Diane [name] is awake and alert. She has already been up in a chair for several hours and was just put back to bed. She did complain of sharp fleeting left chest pain after move. Per staff she has had a quiet 24 hrs. She has developed several bruises on her arm.   Objective: Lab:  Recent Labs  09/29/13 2000 09/30/13 0510 10/01/13 0601  WBC 13.5* 13.6* 13.9*  HGB 11.2* 11.5* 12.2  HCT 32.1* 33.3* 35.5*  MCV 88.9 89.8 89.4  PLT 166 83* 83*    Recent Labs  09/30/13 0510 10/01/13 0601 10/02/13 0450  NA 128* 126* 125*  K 3.3* 3.3* 4.7  CL 85* 85* 85*  GLUCOSE 89 96 103*  BUN 43* 49* 49*  CREATININE 1.23* 1.26* 1.27*  CALCIUM 8.1* 8.2* 8.2*    Imaging:  Scheduled Meds: . aspirin EC  81 mg Oral Daily  . azithromycin  250 mg Oral Daily  . cefTRIAXone (ROCEPHIN)  IV  1 g Intravenous Q24H  . docusate sodium  100 mg Oral QHS  . enoxaparin (LOVENOX) injection  30 mg Subcutaneous Q24H  . feeding supplement (ENSURE COMPLETE)  237 mL Oral BID BM  . loratadine  10 mg Oral Daily  . multivitamin with minerals  1 tablet Oral Daily  . pantoprazole  80 mg Oral Daily  . propranolol  20 mg Oral BID  . sodium chloride  3 mL Intravenous Q12H  . tamsulosin  0.4 mg Oral Daily   Continuous Infusions:  PRN Meds:.acetaminophen, acetaminophen, albuterol, ondansetron (ZOFRAN) IV, ondansetron   Physical Exam: Filed Vitals:   10/02/13 0605  BP: 100/66  Pulse: 85  Temp: 97.8 F (36.6 C)  Resp: 17  gen'l - chipper elderly woman in no distress HEENT - normal Cor- 2+ radial, RRR Pulm - normal respirations, O2 sat - normal Abd- soft Neuro - awake and alert and cooperative       Assessment/Plan: 1. ID - resolving UTI and CAP #6 rocephin, #4 azithromyicn. No fever, no cough. Has been instructed in using of IS. Plan Continue present regimen  CBC in AM  2. Metabolic -hyponatremia is worse, potassium is corrected Plan NS at 50 cc/hr to correct Na  3. ARI - mild  progression. Plan Gentle IV hydration  Bmet in AM  4. Cardiac has had 6 doses of IV lasix. Total I/O = +2,324 (was 3,774 ahead). No clinical evidence of failure Plan F/u CXR  BNP in AM  5. GU - still has foley. Has been started on flomax   [employer] IM (o) [phone]; (c) [phone] Call-grp - Diane [name] IM  Tele: [phone]  10/02/2013, 6:57 AM

## 2013-10-02 NOTE — Progress Notes (Signed)
Physical Therapy Treatment Patient Details Name: STEPHAINE BRESHEARS MRN: [id number] DOB: 11-Sep-1924 Today's Date: 10/02/2013 Time: 0865-7846 PT Time Calculation (min): 21 min  PT Assessment / Plan / Recommendation  History of Present Illness Mrs. Betke admitted with acute UTI, acute renal insufficiency, metabolic acidosis requiring bicarb qtt, hyponatremia.    PT Comments   Pt required max encouragement to participate but pleasant.  Fatigues quickly.  Educated pt on importance of increasing OOB activity.  Cont to strongly recommend SNF at d/c to maximize strength, safety, & independence with functional mobility.     Follow Up Recommendations  SNF     Does the patient have the potential to tolerate intense rehabilitation     Barriers to Discharge        Equipment Recommendations  None recommended by PT    Recommendations for Other Services    Frequency Min 3X/week   Progress towards PT Goals Progress towards PT goals: Progressing toward goals  Plan Current plan remains appropriate    Precautions / Restrictions Restrictions Weight Bearing Restrictions: No       Mobility  Bed Mobility Bed Mobility: Supine to Sit;Sitting - Scoot to Edge of Bed;Sit to Supine Supine to Sit: 4: Min guard;With rails;HOB elevated Sitting - Scoot to Edge of Bed: 4: Min guard Sit to Supine: 4: Min guard;HOB flat Details for Bed Mobility Assistance: cues for technique.  Incr time but able to complete without physical (A) Transfers Transfers: Sit to Stand;Stand to Sit Sit to Stand: 4: Min guard;With upper extremity assist;From bed Stand to Sit: 4: Min guard;With upper extremity assist;To bed Details for Transfer Assistance: cues for hand placement & body positioning before sitting Ambulation/Gait Ambulation/Gait Assistance: 4: Min guard Ambulation Distance (Feet): 80 Feet Assistive device: Rolling walker Ambulation/Gait Assistance Details: cues for safe use of RW, body positioning inside walker, &  encouragement to increase distance.  Pt fatigues quickly.   Gait Pattern: Decreased stride length;Decreased step length - right;Decreased step length - left;Shuffle Gait velocity: slow Stairs: No Wheelchair Mobility Wheelchair Mobility: No      PT Goals (current goals can now be found in the care plan section) Acute Rehab PT Goals PT Goal Formulation: With patient/family Time For Goal Achievement: 10/12/13 Potential to Achieve Goals: Good  Visit Information  Last PT Received On: 10/02/13 Assistance Needed: +1 History of Present Illness: Mrs. Riedesel admitted with acute UTI, acute renal insufficiency, metabolic acidosis requiring bicarb qtt, hyponatremia.     Subjective Data  Subjective: "I can't right now.  I have a bad heart"   Cognition  Cognition Arousal/Alertness: Awake/alert Behavior During Therapy: WFL for tasks assessed/performed Overall Cognitive Status: No family/caregiver present to determine baseline cognitive functioning    Balance     End of Session PT - End of Session Activity Tolerance: Patient tolerated treatment well Patient left: in bed;with call bell/phone within reach;with nursing/sitter in room Nurse Communication: Mobility status   GP     Lara Mulch 10/02/2013, 11:39 AM  Verdell Face, PTA [phone] 10/02/2013

## 2013-10-02 NOTE — Clinical Social Work Note (Signed)
Family requesting refax of patient's information to determine which beds will take her with a Medicaid pending application since she will require long term care. CSW provided family with contact information for [facility], including contact information for [place]. CSW will continue to follow.   Roddie Mc, [place], [place], [phone]

## 2013-10-03 LAB — CBC WITH DIFFERENTIAL/PLATELET
Basophils Absolute: 0 10*3/uL (ref 0.0–0.1)
Basophils Relative: 0 % (ref 0–1)
Eosinophils Absolute: 0.2 10*3/uL (ref 0.0–0.7)
Eosinophils Relative: 2 % (ref 0–5)
Lymphs Abs: 2.1 10*3/uL (ref 0.7–4.0)
MCH: 30.5 pg (ref 26.0–34.0)
MCHC: 34.4 g/dL (ref 30.0–36.0)
MCV: 88.8 fL (ref 78.0–100.0)
Monocytes Relative: 14 % — ABNORMAL HIGH (ref 3–12)
Neutro Abs: 7.5 10*3/uL (ref 1.7–7.7)
Neutrophils Relative %: 65 % (ref 43–77)
Platelets: 107 10*3/uL — ABNORMAL LOW (ref 150–400)
RDW: 15.4 % (ref 11.5–15.5)

## 2013-10-03 LAB — NA AND K (SODIUM & POTASSIUM), RAND UR
Potassium Urine: 35 mEq/L
Sodium, Ur: <10 mEq/L
Sodium, Ur: <10 mEq/L

## 2013-10-03 LAB — BASIC METABOLIC PANEL
Calcium: 8.2 mg/dL — ABNORMAL LOW (ref 8.4–10.5)
GFR calc Af Amer: 46 mL/min — ABNORMAL LOW (ref >90)
GFR calc non Af Amer: 40 mL/min — ABNORMAL LOW (ref >90)
Glucose, Bld: 103 mg/dL — ABNORMAL HIGH (ref 70–99)
Potassium: 4.5 mEq/L (ref 3.5–5.1)
Sodium: 124 mEq/L — ABNORMAL LOW (ref 135–145)

## 2013-10-03 MED ORDER — CEFUROXIME AXETIL 250 MG PO TABS
250.0000 mg | ORAL_TABLET | Freq: Two times a day (BID) | ORAL | Status: DC
  Administered 2013-10-03 – 2013-10-10 (×15): 250 mg via ORAL
  Filled 2013-10-03 (×20): qty 1

## 2013-10-03 NOTE — Care Management Note (Addendum)
    Page 1 of 1   10/12/2013     4:26:03 PM   CARE MANAGEMENT NOTE 10/12/2013  Patient:  Diane [name], Diane [name]   Account Number:  [id number]  Date Initiated:  10/03/2013  Documentation initiated by:  Letha Cape  Subjective/Objective Assessment:   dx uti,cap, hyponatremia, ari, urinary retention  admit- lives alone.     Action/Plan:   pt eval- rec snf.   Anticipated DC Date:  10/13/2013   Anticipated DC Plan:  SKILLED NURSING FACILITY  In-house referral  Clinical Social Worker      DC Planning Services  CM consult      Choice offered to / List presented to:             Status of service:  Completed, signed off Medicare Important Message given?   (If response is "NO", the following Medicare IM given date fields will be blank) Date Medicare IM given:   Date Additional Medicare IM given:    Discharge Disposition:  SKILLED NURSING FACILITY  Per UR Regulation:  Reviewed for med. necessity/level of care/duration of stay  If discussed at Long Length of Stay Meetings, dates discussed:   10/03/2013  10/10/2013  10/12/2013    Comments:  10/12/13 12:32 Letha Cape RN, BSN [phone] patient is for dc today to snf, CSW following.  Per CSW patient is not dc'ing today per MD, her bp dropped, will plan for dc in am.  10/03/13 15:12 Letha Cape RN, BSN [phone] patient lives alone, per physical therapy recs snf, CSW following. Patient c/o chest pain and had urinary retention, now on voiding trial and fluid restriction. Plan is for SNF when stable.

## 2013-10-03 NOTE — Progress Notes (Signed)
Pt's IV infiltrated. IV was removed.

## 2013-10-03 NOTE — Progress Notes (Signed)
Patient's B/P 88/54 calling Dr. Debby Bud to make him aware will continue to monitor.

## 2013-10-03 NOTE — Clinical Social Work Note (Signed)
Patient's family updated on available long term SNF beds for patient.   Roddie Mc, [place], [place], [phone]

## 2013-10-03 NOTE — Progress Notes (Signed)
Physical Therapy Treatment Patient Details Name: Diane [name] MRN: [id number] DOB: 11/09/1924 Today's Date: 10/03/2013 Time: 4098-1191 PT Time Calculation (min): 17 min  PT Assessment / Plan / Recommendation  History of Present Illness Pt states she feels "rotten"   PT Comments   Multiple attempts, pt with decreased BP per nursing.  Pt states she will get out of bed with nursing after lunch  Follow Up Recommendations  SNF     Does the patient have the potential to tolerate intense rehabilitation     Barriers to Discharge        Equipment Recommendations       Recommendations for Other Services    Frequency     Progress towards PT Goals Progress towards PT goals: Not progressing toward goals - comment (limited by hypotension today)  Plan Current plan remains appropriate    Precautions / Restrictions     Pertinent Vitals/Pain O2 sats 97%    Mobility  Bed Mobility Bed Mobility: Supine to Sit;Sit to Supine Supine to Sit: 4: Min assist Sit to Supine: 4: Min assist Details for Bed Mobility Assistance: attempted getting to edge of bed x2. Pt was able to get there each time, but states 'I gotta lay back down" and returned to supine. Contacted RN who said that pt agreed . After about 45 minuted, asked pt again to get up to chair and she deferred. Pt with decreased BP [name] in room    Exercises General Exercises - Lower Extremity Ankle Circles/Pumps: AROM;Both;5 reps;Supine (edema in lowe legs noted) Short Arc Quad: AROM;Both;5 reps (pt needs encouragement)   PT Diagnosis:    PT Problem List:   PT Treatment Interventions:     PT Goals (current goals can now be found in the care plan section)    Visit Information  Last PT Received On: 10/03/13 Assistance Needed: +1 History of Present Illness: Pt states she feels "rotten"    Subjective Data      Cognition       Balance     End of Session PT - End of Session Activity Tolerance: Treatment limited secondary to  medical complications (Comment) (syptomatic hypotension) Patient left: in bed;with call bell/phone within reach;with family/visitor present Nurse Communication: Mobility status   GP    Diane [name], [place] [phone] 10/03/2013, 12:01 PM

## 2013-10-03 NOTE — Progress Notes (Signed)
Subjective: Diane [name] is awake and alert. She voices no complaints.  Objective: Lab:  Recent Labs  10/01/13 0601  WBC 13.9*  HGB 12.2  HCT 35.5*  MCV 89.4  PLT 83*    Recent Labs  10/01/13 0601 10/02/13 0450  NA 126* 125*  K 3.3* 4.7  CL 85* 85*  GLUCOSE 96 103*  BUN 49* 49*  CREATININE 1.26* 1.27*  CALCIUM 8.2* 8.2*    Imaging:  Scheduled Meds: . aspirin EC  81 mg Oral Daily  . azithromycin  250 mg Oral Daily  . cefTRIAXone (ROCEPHIN)  IV  1 g Intravenous Q24H  . docusate sodium  100 mg Oral QHS  . enoxaparin (LOVENOX) injection  30 mg Subcutaneous Q24H  . feeding supplement (ENSURE COMPLETE)  237 mL Oral BID BM  . furosemide  40 mg Oral Daily  . loratadine  10 mg Oral Daily  . multivitamin with minerals  1 tablet Oral Daily  . pantoprazole  80 mg Oral Daily  . propranolol  20 mg Oral BID  . sodium chloride  3 mL Intravenous Q12H  . tamsulosin  0.4 mg Oral Daily   Continuous Infusions:  PRN Meds:.acetaminophen, acetaminophen, albuterol, ondansetron (ZOFRAN) IV, ondansetron   Physical Exam: Filed Vitals:   10/02/13 2127  BP: 88/58  Pulse: 80  Temp: 97.5 F (36.4 C)  Resp: 22    Intake/Output Summary (Last 24 hours) at 10/03/13 0657 Last data filed at 10/03/13 0359  Gross per 24 hour  Intake    140 ml  Output    300 ml  Net   -160 ml   Total I/O this adm: +2,164 Gen'l- pleasant elderly women in no distress HEENT- [place]/AT, C&S clear Neck- supple, no JVD Cor 2+ radial, RRR Pulm - good breath sounds, no increased WOB Ab- BS+, soft, no guarding or rebound GU - foley in place Ext - right proximal UE swollen, cool to touch Derm - bruising left hand, right arm Neuro - A&O x 3, non-focal exam     Assessment/Plan: 1. ID - completing treatment for CAP/UTI. #7/7 rocephin, #5/5 rocephin. No fever. WBC 11/23 improved. Plan Convert to oral ceftin for several more days  2. Metabolic - persistent hyponatremia - moderate w/o symptoms. Did not  improve with IV NS - suspect she is euvolemic at this point. Plan Urine Na, K  D/c IVF  Fluid restrict to 1 liter per day  Add cortisol to AM labs drawn.  3. Acute renal insufficiency - creatinine remains stable but elevated at 1.27.  Plan Fluid restrict  F/u Bmet  4. Cardiac - has received IV lasix. No clinical signs of failure. F/uBNP pending. Plan No add'l lasix  Fluid restrict  5. GU - has foley in Plan D/c foley for voiding trial  Continue flomax.   Dispo - SNF when stable    [employer] IM (o) [phone]; (c) [phone] Call-grp - [name] IM  Tele: [phone]  10/03/2013, 6:57 AM

## 2013-10-04 DIAGNOSIS — N289 Disorder of kidney and ureter, unspecified: Secondary | ICD-10-CM

## 2013-10-04 LAB — BASIC METABOLIC PANEL
BUN: 42 mg/dL — ABNORMAL HIGH (ref 6–23)
Chloride: 86 mEq/L — ABNORMAL LOW (ref 96–112)
Creatinine, Ser: 1.08 mg/dL (ref 0.50–1.10)
GFR calc Af Amer: 52 mL/min — ABNORMAL LOW (ref >90)
Glucose, Bld: 109 mg/dL — ABNORMAL HIGH (ref 70–99)

## 2013-10-04 LAB — CORTISOL-AM, BLOOD: Cortisol - AM: 26.9 ug/dL — ABNORMAL HIGH (ref 4.3–22.4)

## 2013-10-04 MED ORDER — SODIUM CHLORIDE 0.9 % IV BOLUS (SEPSIS)
250.0000 mL | Freq: Once | INTRAVENOUS | Status: AC
  Administered 2013-10-04: 250 mL via INTRAVENOUS

## 2013-10-04 NOTE — Progress Notes (Signed)
Subjective: Soft BP yesterday noted. Patient remained asymptomatic. No reports of any major change or adverse events.   Diane [name] is awake, asking for water. She reports that she doesn't feel good but has no specific or focal c/o. Does not recall if she has been able to urinate but she does recall having a bowel movement.  Objective: Lab:  Recent Labs  10/03/13 0615  WBC 11.4*  NEUTROABS 7.5  HGB 12.3  HCT 35.8*  MCV 88.8  PLT 107*    Recent Labs  10/02/13 0450 10/03/13 0615  NA 125* 124*  K 4.7 4.5  CL 85* 85*  GLUCOSE 103* 103*  BUN 49* 46*  CREATININE 1.27* 1.19*  CALCIUM 8.2* 8.2*   AM lab pending (Bmet) Imaging:  Scheduled Meds: . aspirin EC  81 mg Oral Daily  . cefUROXime  250 mg Oral BID WC  . docusate sodium  100 mg Oral QHS  . enoxaparin (LOVENOX) injection  30 mg Subcutaneous Q24H  . feeding supplement (ENSURE COMPLETE)  237 mL Oral BID BM  . furosemide  40 mg Oral Daily  . loratadine  10 mg Oral Daily  . multivitamin with minerals  1 tablet Oral Daily  . pantoprazole  80 mg Oral Daily  . propranolol  20 mg Oral BID  . sodium chloride  3 mL Intravenous Q12H  . tamsulosin  0.4 mg Oral Daily   Continuous Infusions:  PRN Meds:.acetaminophen, acetaminophen, albuterol, ondansetron (ZOFRAN) IV, ondansetron   Physical Exam: Filed Vitals:   10/04/13 0645  BP: 101/57  Pulse: 84  Temp: 97.3 F (36.3 C)  Resp: 20    Intake/Output Summary (Last 24 hours) at 10/04/13 5621 Last data filed at 10/04/13 0112  Gross per 24 hour  Intake     60 ml  Output    690 ml  Net   -630 ml   Total this adm: +1,534.8 Gen'l- elderly woman in no distress HEENT - C&S clear, PERRLA Neck - supple Cor 2+ radial, RRR Pulm - normal respirations, no rales or wheezes Abd - BS+, soft, no distended or full bladder to percussion, no suprapubic tenderness Ext - right proximal UE warm, swelling is diminished compared to yesterday. Neuro - A&O x2, speech is  clear.     Assessment/Plan: 1. ID - #7/7 rocephin, #5/5 azithromycin, #2 ceftin. No fever. Last WBC trending down. Plan Follow up CBC  2. Metabolic - Na 124 yesterday, lab for today is pending. On fluid restriction. Urine Na low. AM cortisol pending Plan Continue fluid restriction.   With normal mentation medical therapy not indicated  3. ARI - lab pending.   4. Cardiac - BNP down 18848 to 12259. No rales on exam. CXR 11/24 read as improving pul edema. Fluid balance negative yesterday, still positive for admission. Plan No change in treatment  5. GU - foley out. No clinical signs of retention. Plan Continue tamsulosin.  Not medically stable for SNF - Bmet pending.    Diane [name] IM (o) [phone]; (c) [phone] Call-grp - Diane [name] IM  Tele: [phone]  10/04/2013, 6:51 AM

## 2013-10-04 NOTE — Progress Notes (Signed)
Pt BP 75/53. MD notified. 250cc. Bolus of NS given. Follow up BP was 84/52. Will continue to monitor pt.

## 2013-10-05 DIAGNOSIS — R413 Other amnesia: Secondary | ICD-10-CM

## 2013-10-05 LAB — CBC
HCT: 35 % — ABNORMAL LOW (ref 36.0–46.0)
MCH: 30.8 pg (ref 26.0–34.0)
MCHC: 34.3 g/dL (ref 30.0–36.0)
Platelets: 134 10*3/uL — ABNORMAL LOW (ref 150–400)
RDW: 15.9 % — ABNORMAL HIGH (ref 11.5–15.5)

## 2013-10-05 LAB — BASIC METABOLIC PANEL
BUN: 40 mg/dL — ABNORMAL HIGH (ref 6–23)
Calcium: 8.3 mg/dL — ABNORMAL LOW (ref 8.4–10.5)
Chloride: 85 mEq/L — ABNORMAL LOW (ref 96–112)
Creatinine, Ser: 1.14 mg/dL — ABNORMAL HIGH (ref 0.50–1.10)
GFR calc Af Amer: 49 mL/min — ABNORMAL LOW (ref >90)
GFR calc non Af Amer: 42 mL/min — ABNORMAL LOW (ref >90)
Glucose, Bld: 97 mg/dL (ref 70–99)
Potassium: 4.9 mEq/L (ref 3.5–5.1)

## 2013-10-05 NOTE — Progress Notes (Signed)
Informed by NT that BP was 86/50, re-checked manually and was 90/52.  Call Dr. Debby Bud office and spoke with answering service who will have someone call me back.  Will continue to monitor and await for return call.  Forbes Cellar, RN

## 2013-10-05 NOTE — Progress Notes (Signed)
Returned call from Dr. Earl Gala, no new orders at this time, just wanted to continue to monitor and call back if manual SBP drops below 80.  Will continue to monitor.  Forbes Cellar, RN

## 2013-10-05 NOTE — Progress Notes (Signed)
Subjective: Sitting up in bed, just finished some lunch. Reports that she feels tired but no specific complaints.    Objective: Lab:  Recent Labs  10/03/13 0615 10/05/13 0358  WBC 11.4* 11.6*  NEUTROABS 7.5  --   HGB 12.3 12.0  HCT 35.8* 35.0*  MCV 88.8 89.7  PLT 107* 134*    Recent Labs  10/03/13 0615 10/04/13 0843 10/05/13 0547  NA 124* 124* 126*  K 4.5 4.1 4.9  CL 85* 86* 85*  GLUCOSE 103* 109* 97  BUN 46* 42* 40*  CREATININE 1.19* 1.08 1.14*  CALCIUM 8.2* 8.2* 8.3*    Imaging:  Scheduled Meds: . aspirin EC  81 mg Oral Daily  . cefUROXime  250 mg Oral BID WC  . docusate sodium  100 mg Oral QHS  . enoxaparin (LOVENOX) injection  30 mg Subcutaneous Q24H  . feeding supplement (ENSURE COMPLETE)  237 mL Oral BID BM  . furosemide  40 mg Oral Daily  . loratadine  10 mg Oral Daily  . multivitamin with minerals  1 tablet Oral Daily  . pantoprazole  80 mg Oral Daily  . propranolol  20 mg Oral BID  . sodium chloride  3 mL Intravenous Q12H  . tamsulosin  0.4 mg Oral Daily   Continuous Infusions:  PRN Meds:.acetaminophen, acetaminophen, albuterol, ondansetron (ZOFRAN) IV, ondansetron   Physical Exam: Filed Vitals:   10/05/13 0451  BP: 101/62  Pulse: 76  Temp: 97.6 F (36.4 C)  Resp: 18   gen'l- elderly woman in no distress Cor- 2+ radial, RRR Pulm - normal respirations, no rales Abd- soft       Assessment/Plan: 1. ID - no fever, WBC stable @ 11.6. Plan Continue Cefti  2. Metabolic - Na 126 on fluid restriction. Plan Bmet in AM  3. ARI - Creatinine stable  4. Cardiac - clinically stable.  5. GU - no report of retention with good uop  Dispo - may be stable for SNF tomorrow - lab is ordered.    [name] [place] IM (o) [phone]; (c) [phone] Call-grp - [name] IM  Tele: [phone]  10/05/2013, 12:31 PM

## 2013-10-06 LAB — CBC WITH DIFFERENTIAL/PLATELET
Basophils Absolute: 0 10*3/uL (ref 0.0–0.1)
Basophils Relative: 0 % (ref 0–1)
HCT: 36.6 % (ref 36.0–46.0)
Lymphocytes Relative: 18 % (ref 12–46)
MCHC: 33.9 g/dL (ref 30.0–36.0)
Monocytes Absolute: 0.7 10*3/uL (ref 0.1–1.0)
Monocytes Relative: 8 % (ref 3–12)
Neutro Abs: 6.8 10*3/uL (ref 1.7–7.7)
Platelets: 153 10*3/uL (ref 150–400)
RBC: 4.11 MIL/uL (ref 3.87–5.11)
RDW: 15.8 % — ABNORMAL HIGH (ref 11.5–15.5)
WBC: 9.4 10*3/uL (ref 4.0–10.5)

## 2013-10-06 LAB — BASIC METABOLIC PANEL
BUN: 38 mg/dL — ABNORMAL HIGH (ref 6–23)
Calcium: 8.2 mg/dL — ABNORMAL LOW (ref 8.4–10.5)
Chloride: 85 mEq/L — ABNORMAL LOW (ref 96–112)
Creatinine, Ser: 1.03 mg/dL (ref 0.50–1.10)
GFR calc Af Amer: 55 mL/min — ABNORMAL LOW (ref >90)
GFR calc non Af Amer: 47 mL/min — ABNORMAL LOW (ref >90)

## 2013-10-06 MED ORDER — BISACODYL 10 MG RE SUPP: 10.0000 mg | Freq: Every day | RECTAL | Status: DC | PRN

## 2013-10-06 MED ORDER — POLYETHYLENE GLYCOL 3350 17 G PO PACK
17.0000 g | PACK | Freq: Every day | ORAL | Status: DC
  Administered 2013-10-06 – 2013-10-09 (×4): 17 g via ORAL
  Filled 2013-10-06 (×5): qty 1

## 2013-10-06 MED ORDER — FLEET ENEMA 7-19 GM/118ML RE ENEM
1.0000 | ENEMA | Freq: Once | RECTAL | Status: AC
  Administered 2013-10-06: 1 via RECTAL
  Filled 2013-10-06: qty 1

## 2013-10-06 MED ORDER — SODIUM CHLORIDE 0.9 % IV SOLN
INTRAVENOUS | Status: DC
  Administered 2013-10-06 – 2013-10-08 (×3): via INTRAVENOUS

## 2013-10-06 MED ORDER — FUROSEMIDE 10 MG/ML IJ SOLN
40.0000 mg | Freq: Two times a day (BID) | INTRAMUSCULAR | Status: AC
  Administered 2013-10-06 – 2013-10-08 (×4): 40 mg via INTRAVENOUS
  Filled 2013-10-06 (×6): qty 4

## 2013-10-06 MED ORDER — ENSURE COMPLETE PO LIQD
237.0000 mL | Freq: Three times a day (TID) | ORAL | Status: DC
  Administered 2013-10-06 – 2013-10-12 (×10): 237 mL via ORAL

## 2013-10-06 NOTE — Progress Notes (Signed)
PM note:  Lab reviewed - Na 121.  A/P 2. Metabolic - sodium has dropped further Plan NS @ 50 cc/hr  Change to IV lasix 40 mg q 12  Renal consult in AM  6. GI- constipation - no notation of BM on doc flowsheet PLan Fleets enema   M.Norins, MD Call: [phone]

## 2013-10-06 NOTE — Clinical Social Work Placement (Signed)
Clinical Social Work Department CLINICAL SOCIAL WORK PLACEMENT NOTE 10/06/2013  Patient:  Diane [name], Diane [name]  Account Number:  [id number] Admit date:  09/27/2013  Clinical Social Worker:  Cherre Blanc, [place]  Date/time:  09/28/2013 01:00 PM  Clinical Social Work is seeking post-discharge placement for this patient at the following level of care:   SKILLED NURSING   (*CSW will update this form in Epic as items are completed)   09/28/2013  Patient/family provided with [facility] Department of Clinical Social Work's list of facilities offering this level of care within the geographic area requested by the patient (or if unable, by the patient's family).  09/28/2013  Patient/family informed of their freedom to choose among providers that offer the needed level of care, that participate in Medicare, Medicaid or managed care program needed by the patient, have an available bed and are willing to accept the patient.  09/28/2013  Patient/family informed of [facility]' ownership interest in [facility], as well as of the fact that they are under no obligation to receive care at this facility.  PASARR submitted to EDS on  PASARR number received from EDS on   FL2 transmitted to all facilities in geographic area requested by pt/family on  09/28/2013 FL2 transmitted to all facilities within larger geographic area on   Patient informed that his/her managed care company has contracts with or will negotiate with  certain facilities, including the following:     Patient/family informed of bed offers received:  09/29/2013 Patient chooses bed at [facility] Physician recommends and patient chooses bed at    Patient to be transferred to [facility] on   Patient to be transferred to facility by   The following physician request were entered in Epic:   Additional Comments:   Roddie Mc, [place], [place], [phone]

## 2013-10-06 NOTE — Progress Notes (Signed)
Physical Therapy Treatment Patient Details Name: Diane [name] MRN: [id number] DOB: 07-21-1924 Today's Date: 10/06/2013 Time: 7829-5621 PT Time Calculation (min): 13 min  PT Assessment / Plan / Recommendation  History of Present Illness Pt. admitted with acute UTI, ARI, metabolic acidosis, hyponatremia   PT Comments   Pt. Willing to participate in there ex of LEs in bed but not willing to try to mobilize with PT.  She is jsut back to bed from using 3n1 with nursing tech.  She easily fatigues.  Has 2/4 dyspnea with little activity.  Follow Up Recommendations  SNF     Does the patient have the potential to tolerate intense rehabilitation     Barriers to Discharge        Equipment Recommendations  None recommended by PT    Recommendations for Other Services    Frequency Min 3X/week   Progress towards PT Goals Progress towards PT goals: Progressing toward goals  Plan Current plan remains appropriate    Precautions / Restrictions Precautions Precautions: Fall Restrictions Weight Bearing Restrictions: No   Pertinent Vitals/Pain See vitals tab 2/4 dyspnea with LE exercises in bed    Mobility  Bed Mobility Bed Mobility: Not assessed Transfers Transfers: Not assessed Ambulation/Gait Ambulation/Gait Assistance: Not tested (comment)    Exercises General Exercises - Lower Extremity Ankle Circles/Pumps: AROM;Both;15 reps;Supine Short Arc Quad: AROM;Both;10 reps;Supine Heel Slides: AROM;Both;10 reps;Supine Hip ABduction/ADduction: AROM;Both;10 reps;Supine   PT Diagnosis:    PT Problem List:   PT Treatment Interventions:     PT Goals (current goals can now be found in the care plan section)    Visit Information  Last PT Received On: 10/06/13 Assistance Needed: +1 History of Present Illness: Pt. admitted with acute UTI, ARI, metabolic acidosis, hyponatremia    Subjective Data  Subjective: Pt. agreeable to in bed exercises but declines OOB  (has just getten back into  bed with nursing tech )   Cognition  Cognition Arousal/Alertness: Awake/alert Behavior During Therapy: WFL for tasks assessed/performed Overall Cognitive Status: No family/caregiver present to determine baseline cognitive functioning Memory: Decreased short-term memory    Balance     End of Session PT - End of Session Activity Tolerance: Patient limited by fatigue Patient left: in bed;with call bell/phone within reach;with bed alarm set Nurse Communication: Mobility status   GP     Ferman Hamming 10/06/2013, 12:59 PM Weldon Picking PT Acute Rehab Services [phone] [place] [phone]

## 2013-10-06 NOTE — Progress Notes (Addendum)
Temp 93.9 oral. On call MD paged. Waiting for response. Will pass on to day shift RN.

## 2013-10-06 NOTE — Progress Notes (Signed)
NUTRITION FOLLOW UP  Intervention:   Increase Ensure Complete po BID to TID, each supplement provides 350 kcal and 13 grams of protein.  Nutrition Dx:   Inadequate oral intake related to dementia as evidenced by variable appetite and H&P; ongoing  Goal:   Pt to meet >/= 90% of their estimated nutrition needs; improving  Monitor:   Wt trends, labs, I/O's, PO intake, acceptance of supplements  Assessment:   PMHx significant for dementia, GERD, HLD, recurrent cystitis, CHF, discharge from hospice a couple of years ago. Admitted with nausea, fevers/chills and abdominal pain. Work-up reveals UTI.  Per H&P, pt currently lives alone and has people coming to cook and clean for her. Family reports that she has been declining over the last 2-3 years ago. Family reports that patient's dentures don't work well. Family now interested in pursuing SNF placement.   Pt is at nutrition risk given her dementia and ongoing chronic medical issues. Pt with no obvious signs of muscle/fat mass loss at this time.  Pt reports that her appetite varies meal to meal. Meal completion recorded in chart is consistently 10%. Pt says that she eats lots of mashed potatoes and that she drinks Ensure Complete.  She says that she has had no problems chewing the foods served to her.   Height: Ht Readings from Last 1 Encounters:  09/27/13 5\' 2"  (1.575 m)    Weight Status:   Wt Readings from Last 1 Encounters:  10/06/13 135 lb 12.9 oz (61.6 kg)    Re-estimated needs:  Kcal: 1550-1800 Protein: 70-80 g Fluid: 1.6-1.8 L  Skin: intact  Diet Order: Dysphagia   Intake/Output Summary (Last 24 hours) at 10/06/13 1043 Last data filed at 10/06/13 0917  Gross per 24 hour  Intake    180 ml  Output    400 ml  Net   -220 ml    Last BM: last BM recorded 11/22   Labs:   Recent Labs Lab 10/03/13 0615 10/04/13 0843 10/05/13 0547  NA 124* 124* 126*  K 4.5 4.1 4.9  CL 85* 86* 85*  CO2 25 27 26   BUN 46* 42* 40*   CREATININE 1.19* 1.08 1.14*  CALCIUM 8.2* 8.2* 8.3*  GLUCOSE 103* 109* 97    CBG (last 3)  No results found for this basename: GLUCAP,  in the last 72 hours  Scheduled Meds: . aspirin EC  81 mg Oral Daily  . cefUROXime  250 mg Oral BID WC  . docusate sodium  100 mg Oral QHS  . enoxaparin (LOVENOX) injection  30 mg Subcutaneous Q24H  . feeding supplement (ENSURE COMPLETE)  237 mL Oral BID BM  . furosemide  40 mg Oral Daily  . loratadine  10 mg Oral Daily  . multivitamin with minerals  1 tablet Oral Daily  . pantoprazole  80 mg Oral Daily  . polyethylene glycol  17 g Oral Daily  . propranolol  20 mg Oral BID  . sodium chloride  3 mL Intravenous Q12H  . tamsulosin  0.4 mg Oral Daily    Continuous Infusions:   Ebbie Latus RD, LDN

## 2013-10-06 NOTE — Progress Notes (Signed)
Subjective: Nursing reports hypothermia to 93.9 F. Also, no BM since 11/22. She is awake and has not complaints except for generalized weakness. Denies abdominal pain. She reports she has been out of bed to chair.  Objective: Lab:  Recent Labs  10/05/13 0358  WBC 11.6*  HGB 12.0  HCT 35.0*  MCV 89.7  PLT 134*    Recent Labs  10/04/13 0843 10/05/13 0547  NA 124* 126*  K 4.1 4.9  CL 86* 85*  GLUCOSE 109* 97  BUN 42* 40*  CREATININE 1.08 1.14*  CALCIUM 8.2* 8.3*    Imaging:  Scheduled Meds: . aspirin EC  81 mg Oral Daily  . cefUROXime  250 mg Oral BID WC  . docusate sodium  100 mg Oral QHS  . enoxaparin (LOVENOX) injection  30 mg Subcutaneous Q24H  . feeding supplement (ENSURE COMPLETE)  237 mL Oral BID BM  . furosemide  40 mg Oral Daily  . loratadine  10 mg Oral Daily  . multivitamin with minerals  1 tablet Oral Daily  . pantoprazole  80 mg Oral Daily  . propranolol  20 mg Oral BID  . sodium chloride  3 mL Intravenous Q12H  . tamsulosin  0.4 mg Oral Daily   Continuous Infusions:  PRN Meds:.acetaminophen, acetaminophen, albuterol, ondansetron (ZOFRAN) IV, ondansetron   Physical Exam: Filed Vitals:   10/06/13 0609  BP:   Pulse:   Temp: 93.9 F (34.4 C)  Resp:    Gen'l- Elderly woman in no distress HEENT - edentulous, no oral lesions (c/o sore tip of her tongue). Cor 2+ radial, RRR Pulm - normal breath sounds w/o rales or wheezes Abd - BS+ x 4, no distension, no guarding or rebound, no bladder distention to palpation or percussion, no suprapubic tenderness Neuro - A&O      Assessment/Plan: 1. ID - Afebrile. Plan CBC   Continue Ceftin  2. Metabolic - Bmet pending Plan Continue fluid restriction  3. ARI -  Bmet pending. Has been stable  4. Cardiac - stable, no clinical signs of decompensation.  5. GU - no bladder distention. Patient reports she has been able to void  6. GI - constipation Plan Dulcolax suppository  Miralax 17 g po  daily  Dispo - labs pending. Hopefully medically stable for transfer soon.    [name] [place] [facility] (o) [phone]; (c) [phone] Call-grp - [name]  Tele: [phone]  10/06/2013, 7:38 AM

## 2013-10-07 ENCOUNTER — Inpatient Hospital Stay (HOSPITAL_COMMUNITY): Payer: Medicare Other

## 2013-10-07 LAB — RENAL FUNCTION PANEL
Albumin: 2.8 g/dL — ABNORMAL LOW (ref 3.5–5.2)
Albumin: 2.6 g/dL — ABNORMAL LOW (ref 3.5–5.2)
BUN: 35 mg/dL — ABNORMAL HIGH (ref 6–23)
BUN: 33 mg/dL — ABNORMAL HIGH (ref 6–23)
CO2: 26 mEq/L (ref 19–32)
Calcium: 8.3 mg/dL — ABNORMAL LOW (ref 8.4–10.5)
Chloride: 85 mEq/L — ABNORMAL LOW (ref 96–112)
Chloride: 88 mEq/L — ABNORMAL LOW (ref 96–112)
Creatinine, Ser: 1.08 mg/dL (ref 0.50–1.10)
Creatinine, Ser: 1.04 mg/dL (ref 0.50–1.10)
GFR calc Af Amer: 52 mL/min — ABNORMAL LOW (ref >90)
GFR calc non Af Amer: 45 mL/min — ABNORMAL LOW (ref >90)
GFR calc non Af Amer: 47 mL/min — ABNORMAL LOW (ref >90)
Phosphorus: 3.7 mg/dL (ref 2.3–4.6)
Potassium: 3.9 mEq/L (ref 3.5–5.1)
Sodium: 126 mEq/L — ABNORMAL LOW (ref 135–145)

## 2013-10-07 NOTE — Consult Note (Signed)
Reason for Consult: Hyponatremia Referring Physician: Dr. [name]  HPI:  Diane [name] is an 79 year old Caucasian woman with past medical history significant for congestive heart failure class IV, chronic hyponatremia (sodium ranging 130-132 based on labs available on Epic) and baseline normal renal function. She was admitted about 10 days ago with a urinary tract infection and volume depletion with associated acute renal failure. The latter problem was managed successfully with intravenous fluids and the former treated with Ceftin (based on cultures-E. coli). Her sodium level has been low and apparently got lower yesterday with fluid restriction prompting intravenous fluids together with furosemide to be given, with sodium rising from 121 to 126. On admission labs, it is noted that her urine sodium was less than 10. Her appetite has been poor-she denies any nausea or vomiting. She reports to be thirsty and has a dry mouth. She has some arthralgia of her left wrist that have improved overnight. Labs are significant for hypoalbuminemia and high BUN. Daily urine output charted as arranged from 400 cc per 24 hours to 1200 cc per 24 hours. Overall she's 1.4 L volume positive since admission.   09/27/2013 15:08 09/28/2013 18:00 09/30/2013 05:10 10/01/2013 06:01 10/03/2013 06:15 10/05/2013 05:47 10/06/2013 11:57 10/07/2013 08:47  Sodium 129 (L) 130 (L) 128 (L) 126 (L) 124 (L) 126 (L) 121 (L) 126 (L)   She does not have a documented history of thyroid disease and takes furosemide 40 mg every other day as an outpatient. She is not on SSRIs or chronic nonsteroidal anti-inflammatory drugs. Chest x-ray as part of abdominal series that was done earlier today shows cardiomegaly with small bilateral pleural effusions.   Past Medical History  Diagnosis Date  . Arthritis     worst:hands and shoulder  . GERD (gastroesophageal reflux disease)   . Glaucoma     s/p laser surgery  . Hyperlipidemia   . Heart  murmur     MVR, AVS  . Migraine     resolved with menopause  . CHF (congestive heart failure), NYHA class IV     had been a hospice patient  . Cystitis     h/o frequent bouts  . Measles     childhood  . Varicella without complication     chilodhood  . Pneumonia 2004  . UTI (urinary tract infection) 09/26/2013  . Dehydration 09/2013    Past Surgical History  Procedure Laterality Date  . Breast biopsy    . Appendectomy      '41  . Tonsillectomy and adenoidectomy  '47  . Cholecystectomy  1980's  . Abdominal hysterectomy      1969  . G4p2    . Shoulder arthroscopy      right    Family History  Problem Relation Age of Onset  . Alcohol abuse Father     Social History:  reports that she has never smoked. She has never used smokeless tobacco. She reports that she does not drink alcohol or use illicit drugs.  Allergies:  Allergies  Allergen Reactions  . Alendronate Sodium   . Biapenem   . Evista [Raloxifene Hydrochloride]   . Lansoprazole   . Levofloxacin   . Lipitor [Atorvastatin Calcium]   . Lyrica [Pregabalin]   . Miacalcin   . Prednisone   . Pyridium [Phenazopyridine Hcl]   . Statins Other (See Comments)    Cardiac reaction  . Sulfa Drugs Cross Reactors Nausea Only  . Welchol [Colesevelam Hcl]   . Zetia [Ezetimibe]  Cardiac reaction  . Penicillins Rash    Medications:  Scheduled: . aspirin EC  81 mg Oral Daily  . cefUROXime  250 mg Oral BID WC  . docusate sodium  100 mg Oral QHS  . enoxaparin (LOVENOX) injection  30 mg Subcutaneous Q24H  . feeding supplement (ENSURE COMPLETE)  237 mL Oral TID BM  . furosemide  40 mg Intravenous Q12H  . loratadine  10 mg Oral Daily  . multivitamin with minerals  1 tablet Oral Daily  . pantoprazole  80 mg Oral Daily  . polyethylene glycol  17 g Oral Daily  . propranolol  20 mg Oral BID  . sodium chloride  3 mL Intravenous Q12H  . tamsulosin  0.4 mg Oral Daily    Results for orders placed during the hospital  encounter of 09/27/13 (from the past 48 hour(s))  BASIC METABOLIC PANEL     Status: Abnormal   Collection Time    10/06/13 11:57 AM      Result Value Range   Sodium 121 (*) 135 - 145 mEq/L   Potassium 4.2  3.5 - 5.1 mEq/L   Chloride 85 (*) 96 - 112 mEq/L   CO2 25  19 - 32 mEq/L   Glucose, Bld 119 (*) 70 - 99 mg/dL   BUN 38 (*) 6 - 23 mg/dL   Creatinine, Ser 4.54  0.50 - 1.10 mg/dL   Calcium 8.2 (*) 8.4 - 10.5 mg/dL   GFR calc non Af Amer 47 (*) >90 mL/min   GFR calc Af Amer 55 (*) >90 mL/min   Comment: (NOTE)     The eGFR has been calculated using the CKD EPI equation.     This calculation has not been validated in all clinical situations.     eGFR's persistently <90 mL/min signify possible Chronic Kidney     Disease.  CBC WITH DIFFERENTIAL     Status: Abnormal   Collection Time    10/06/13 11:57 AM      Result Value Range   WBC 9.4  4.0 - 10.5 K/uL   RBC 4.11  3.87 - 5.11 MIL/uL   Hemoglobin 12.4  12.0 - 15.0 g/dL   HCT 09.8  11.9 - 14.7 %   MCV 89.1  78.0 - 100.0 fL   MCH 30.2  26.0 - 34.0 pg   MCHC 33.9  30.0 - 36.0 g/dL   RDW 82.9 (*) 56.2 - 13.0 %   Platelets 153  150 - 400 K/uL   Neutrophils Relative % 73  43 - 77 %   Neutro Abs 6.8  1.7 - 7.7 K/uL   Lymphocytes Relative 18  12 - 46 %   Lymphs Abs 1.7  0.7 - 4.0 K/uL   Monocytes Relative 8  3 - 12 %   Monocytes Absolute 0.7  0.1 - 1.0 K/uL   Eosinophils Relative 2  0 - 5 %   Eosinophils Absolute 0.2  0.0 - 0.7 K/uL   Basophils Relative 0  0 - 1 %   Basophils Absolute 0.0  0.0 - 0.1 K/uL  RENAL FUNCTION PANEL     Status: Abnormal   Collection Time    10/07/13  8:47 AM      Result Value Range   Sodium 126 (*) 135 - 145 mEq/L   Potassium 4.3  3.5 - 5.1 mEq/L   Chloride 85 (*) 96 - 112 mEq/L   CO2 26  19 - 32 mEq/L   Glucose, Bld 109 (*)  70 - 99 mg/dL   BUN 35 (*) 6 - 23 mg/dL   Creatinine, Ser 4.54  0.50 - 1.10 mg/dL   Calcium 8.3 (*) 8.4 - 10.5 mg/dL   Phosphorus 3.9  2.3 - 4.6 mg/dL   Albumin 2.8 (*) 3.5 -  5.2 g/dL   GFR calc non Af Amer 45 (*) >90 mL/min   GFR calc Af Amer 52 (*) >90 mL/min   Comment: (NOTE)     The eGFR has been calculated using the CKD EPI equation.     This calculation has not been validated in all clinical situations.     eGFR's persistently <90 mL/min signify possible Chronic Kidney     Disease.    Dg Abd 1 View  10/07/2013   CLINICAL DATA:  Constipation.  Abdominal pain.  EXAM: ABDOMEN - 1 VIEW  COMPARISON:  10/16/2005.  FINDINGS: Surgical clips right upper quadrant. Surgical clips in the pelvis. Gas pattern is nonspecific. Stool noted throughout the colon. There is no bowel distention or free air. Vascular calcification noted.  Abdominal aortic aneurysm cannot be excluded. Aortic ultrasound suggested for further evaluation.  Cardiomegaly. Mitral valve annular calcification. Tiny pleural effusions.  IMPRESSION: 1. Subtle changes suggesting abdominal aortic aneurysm. Aortic ultrasound suggested for further evaluation. 2. Large amount of stool in colon.  No bowel distention. 3. Cardiomegaly with small pleural effusions.   Electronically Signed   By: Maisie Fus  Register   On: 10/07/2013 09:59    Review of Systems  Constitutional: Positive for chills and malaise/fatigue. Negative for fever, weight loss and diaphoresis.  HENT: Negative for ear pain, hearing loss and tinnitus.        Complains of dry mouth/throat  Eyes: Negative.   Respiratory: Negative.   Cardiovascular: Positive for leg swelling. Negative for chest pain, palpitations, orthopnea, claudication and PND.  Gastrointestinal: Negative.   Genitourinary: Negative.   Musculoskeletal: Positive for back pain and joint pain. Negative for myalgias and neck pain.       Left wrist and right shoulder. Also complains of pain in her upper back  Skin: Negative.   Neurological: Positive for weakness. Negative for dizziness, tingling, tremors and headaches.  Endo/Heme/Allergies: Negative.   Psychiatric/Behavioral: Negative.     Blood pressure 108/67, pulse 86, temperature 97 F (36.1 C), temperature source Oral, resp. rate 18, height 5\' 2"  (1.575 m), weight 61.7 kg (136 lb 0.4 oz), SpO2 98.00%. Physical Exam  Nursing note and vitals reviewed. Constitutional: She appears well-developed. No distress.  Bitemporal wasting  HENT:  Head: Atraumatic.  Nose: Nose normal.  Dry oral mucosa  Eyes: Conjunctivae and EOM are normal. Pupils are equal, round, and reactive to light. No scleral icterus.  Neck: Normal range of motion. Neck supple. No JVD present. No tracheal deviation present. No thyromegaly present.  Cardiovascular: Normal rate and regular rhythm.   Murmur heard. S2 snap with 2/6 HSM over outflow tract  Respiratory: Breath sounds normal. No respiratory distress. She has no wheezes. She has no rales.  Poor inspiratory effort  GI: Soft. Bowel sounds are normal. She exhibits no distension. There is no tenderness. There is no rebound.  Musculoskeletal: She exhibits edema. She exhibits no tenderness.  2+ bilateral lower extremity edema  Neurological: She is alert.  Skin: Skin is warm and dry. No rash noted. No erythema.  Psychiatric: She has a normal mood and affect. Her behavior is normal.    Assessment/Plan: 1. Hyponatremia-acute worsening of chronic hyponatremia. I suspect that her baseline hyponatremia is due  to multiple etiologies including her history of congestive heart failure, hypoalbuminemia/malnutrition and possibly a reset osmostat. I'm somewhat suspicious of the acute sodium drop from labs yesterday and the rapid improvement with minimal intravenous fluids overnight. From a clinical standpoint, she appears to be intravascularly depleted with significant interstitial fluid (edema/pleural effusions) that clearly poses a management difficulty. I will check serum osmolality, urine osmolality (likely erroneous with diuretics) and serum cortisol level (given her recent hypotension episodes) to further decide  on whether intravenous fluids or warranted or whether diuretics need further adjustment. Meanwhile, continue her on the current dose of furosemide. 2. Urinary tract infection: Improving-she is asymptomatic/afebrile. 3. Malnutrition: Poor albumin noted-this may be a reflection of the acute infection/inflammatory reaction versus chronic malnutrition-we'll start her on an oral nutrition supplement. 4. History of congestive heart failure: Chronic and currently does not appear to be decompensated. On diuretic.  Pesach Frisch K. 10/07/2013, 11:01 AM

## 2013-10-07 NOTE — Progress Notes (Signed)
Subjective: Diane [name] is awake and alert. She does not appear to be uncomfortable. Poor appetite.   Objective: Lab:  Recent Labs  10/05/13 0358 10/06/13 1157  WBC 11.6* 9.4  NEUTROABS  --  6.8  HGB 12.0 12.4  HCT 35.0* 36.6  MCV 89.7 89.1  PLT 134* 153    Recent Labs  10/05/13 0547 10/06/13 1157  NA 126* 121*  K 4.9 4.2  CL 85* 85*  GLUCOSE 97 119*  BUN 40* 38*  CREATININE 1.14* 1.03  CALCIUM 8.3* 8.2*    Imaging:  Scheduled Meds: . aspirin EC  81 mg Oral Daily  . cefUROXime  250 mg Oral BID WC  . docusate sodium  100 mg Oral QHS  . enoxaparin (LOVENOX) injection  30 mg Subcutaneous Q24H  . feeding supplement (ENSURE COMPLETE)  237 mL Oral TID BM  . furosemide  40 mg Intravenous Q12H  . loratadine  10 mg Oral Daily  . multivitamin with minerals  1 tablet Oral Daily  . pantoprazole  80 mg Oral Daily  . polyethylene glycol  17 g Oral Daily  . propranolol  20 mg Oral BID  . sodium chloride  3 mL Intravenous Q12H  . tamsulosin  0.4 mg Oral Daily   Continuous Infusions: . sodium chloride 50 mL/hr at 10/06/13 1941   PRN Meds:.acetaminophen, acetaminophen, albuterol, bisacodyl, ondansetron (ZOFRAN) IV, ondansetron   Physical Exam: Filed Vitals:   10/07/13 0714  BP: 100/70  Pulse: 86  Temp: 95.7 F (35.4 C)  Resp: 18  gen'l - elderly woman in no distress HEENT - [place]/AT, C&S clear Cor - 2+ radial, RRR Pulm -  Normal respirations Abd - soft , not tender. Neuro - at her baseline.       Assessment/Plan: 1. ID - appears stable. Continue ceftin one more day  2. Metabolic - Sodium dropped to 121! Restarted NS @ 50 cc/hr and IV lasix twice a day. AM lab pending Plan If Na remains low will obtain renal consult  3. ARI - lab pending  4. Cardiac - stable w/o signs of failure  5. GU - no bladder distention on exam. UOP approx 25cc/ hr.  6. GI - small hard stool last PM recorded. Plan  Continue regimen - miralax daily.    [name] [place]  IM (o) [phone]; (c) [phone] Call-grp - [name] IM  Tele: [phone]  10/07/2013, 8:43 AM

## 2013-10-08 ENCOUNTER — Inpatient Hospital Stay (HOSPITAL_COMMUNITY): Payer: Medicare Other

## 2013-10-08 LAB — BLOOD GAS, ARTERIAL
Bicarbonate: 27.7 mEq/L — ABNORMAL HIGH (ref 20.0–24.0)
Drawn by: 27051
O2 Content: 0.5 L/min
O2 Saturation: 80.4 %
pCO2 arterial: 38.3 mmHg (ref 35.0–45.0)
pH, Arterial: 7.472 — ABNORMAL HIGH (ref 7.350–7.450)
pO2, Arterial: 45.1 mmHg — ABNORMAL LOW (ref 80.0–100.0)

## 2013-10-08 LAB — RENAL FUNCTION PANEL
Albumin: 2.5 g/dL — ABNORMAL LOW (ref 3.5–5.2)
BUN: 32 mg/dL — ABNORMAL HIGH (ref 6–23)
Chloride: 89 mEq/L — ABNORMAL LOW (ref 96–112)
Creatinine, Ser: 1.08 mg/dL (ref 0.50–1.10)
GFR calc Af Amer: 52 mL/min — ABNORMAL LOW (ref >90)
Glucose, Bld: 104 mg/dL — ABNORMAL HIGH (ref 70–99)
Potassium: 3.7 mEq/L (ref 3.5–5.1)

## 2013-10-08 MED ORDER — ENOXAPARIN SODIUM 40 MG/0.4ML ~~LOC~~ SOLN
40.0000 mg | SUBCUTANEOUS | Status: DC
  Administered 2013-10-09 – 2013-10-10 (×2): 40 mg via SUBCUTANEOUS
  Filled 2013-10-08 (×4): qty 0.4

## 2013-10-08 MED ORDER — FUROSEMIDE 40 MG PO TABS
40.0000 mg | ORAL_TABLET | Freq: Every day | ORAL | Status: DC
  Filled 2013-10-08: qty 1

## 2013-10-08 MED ORDER — WHITE PETROLATUM GEL
Status: AC
  Administered 2013-10-08: 23:00:00
  Filled 2013-10-08: qty 5

## 2013-10-08 NOTE — Progress Notes (Signed)
10/08/13 Family requested for patient to have ABG . Call placed  To Physician's answering service for order.

## 2013-10-08 NOTE — Progress Notes (Addendum)
Patient complaining of SOB.  She is due for lasix but BP is soft at 88/55 so MD notified.  Recent results of ABG were given to MD on call.  Stat chest xray ordered, as well as, the holding of IV fluids and lasix.  Will continue to monitor patient progress.

## 2013-10-08 NOTE — Progress Notes (Signed)
Patient ID: Diane [name], female   DOB: [birth year], 79 y.o.   MRN: [id number]   [facility] Progress Note    Assessment/ Plan:   1. Hyponatremia-acute worsening of chronic hyponatremia. Improved with diuretic-we'll decrease furosemide to 40 mg daily from twice a day. Continue with fluid restriction. Serum cortisol levels not indicative of adrenal insufficiency, serum osmolality is low and consistent with a true hyponatremia. High urine osmolality difficult to interpret with recent diuretic use. No indication at this time for hypertonic saline or aquaretic. She has dry mucosal membranes but significant interstitial fluid. 2. Urinary tract infection: Improving-she is asymptomatic/afebrile. Anticipated discharge to skilled nursing facility tomorrow if stable. 3. Malnutrition: Poor albumin noted-this may be a reflection of the acute infection/inflammatory reaction versus chronic malnutrition-on nutritional supplementation  4. History of congestive heart failure: Chronic and currently does not appear to be decompensated. On diuretic.  Subjective:   Reports that she is thirsty otherwise feels well -complains of intermittent shoulder pain.    Objective:   BP 100/65  Pulse 96  Temp(Src) 96.9 F (36.1 C) (Rectal)  Resp 17  Ht 5\' 2"  (1.575 m)  Wt 62.7 kg (138 lb 3.7 oz)  BMI 25.28 kg/m2  SpO2 100%  Intake/Output Summary (Last 24 hours) at 10/08/13 1043 Last data filed at 10/08/13 0800  Gross per 24 hour  Intake 1362.17 ml  Output    325 ml  Net 1037.17 ml   Weight change:   Physical Exam: Gen: Confused, hard of hearing-resting comfortably CVS: Pulse regular in rate and rhythm, S1 normal with S2 snap Resp: Clear to auscultation bilaterally-anterior chest Abd: Soft, flat, nontender Ext: 2+ lower extremity edema  Imaging: Dg Abd 1 View  10/07/2013   CLINICAL DATA:  Constipation.  Abdominal pain.  EXAM: ABDOMEN - 1 VIEW  COMPARISON:  10/16/2005.  FINDINGS: Surgical clips  right upper quadrant. Surgical clips in the pelvis. Gas pattern is nonspecific. Stool noted throughout the colon. There is no bowel distention or free air. Vascular calcification noted.  Abdominal aortic aneurysm cannot be excluded. Aortic ultrasound suggested for further evaluation.  Cardiomegaly. Mitral valve annular calcification. Tiny pleural effusions.  IMPRESSION: 1. Subtle changes suggesting abdominal aortic aneurysm. Aortic ultrasound suggested for further evaluation. 2. Large amount of stool in colon.  No bowel distention. 3. Cardiomegaly with small pleural effusions.   Electronically Signed   ByMaisie Fus  Register   On: 10/07/2013 09:59    Labs: BMET  Recent Labs Lab 10/03/13 0615 10/04/13 0843 10/05/13 0547 10/06/13 1157 10/07/13 0847 10/07/13 1314 10/08/13 0530  NA 124* 124* 126* 121* 126* 128* 127*  K 4.5 4.1 4.9 4.2 4.3 3.9 3.7  CL 85* 86* 85* 85* 85* 88* 89*  CO2 25 27 26 25 26 25 26   GLUCOSE 103* 109* 97 119* 109* 100* 104*  BUN 46* 42* 40* 38* 35* 33* 32*  CREATININE 1.19* 1.08 1.14* 1.03 1.08 1.04 1.08  CALCIUM 8.2* 8.2* 8.3* 8.2* 8.3* 7.9* 8.2*  PHOS  --   --   --   --  3.9 3.7 3.7   CBC  Recent Labs Lab 10/03/13 0615 10/05/13 0358 10/06/13 1157  WBC 11.4* 11.6* 9.4  NEUTROABS 7.5  --  6.8  HGB 12.3 12.0 12.4  HCT 35.8* 35.0* 36.6  MCV 88.8 89.7 89.1  PLT 107* 134* 153    Medications:    . aspirin EC  81 mg Oral Daily  . cefUROXime  250 mg Oral BID WC  .  docusate sodium  100 mg Oral QHS  . enoxaparin (LOVENOX) injection  30 mg Subcutaneous Q24H  . feeding supplement (ENSURE COMPLETE)  237 mL Oral TID BM  . furosemide  40 mg Intravenous Q12H  . loratadine  10 mg Oral Daily  . multivitamin with minerals  1 tablet Oral Daily  . pantoprazole  80 mg Oral Daily  . polyethylene glycol  17 g Oral Daily  . propranolol  20 mg Oral BID  . sodium chloride  3 mL Intravenous Q12H  . tamsulosin  0.4 mg Oral Daily   Zetta Bills, MD 10/08/2013, 10:43 AM

## 2013-10-08 NOTE — Progress Notes (Signed)
Nephrology consulted on patient 10/07/13.  CSW continues to await stability per MD for d/c. Will be admitted to [facility] when medically stable per MD.  CSW will follow.  Lorri Frederick. [place]  [phone] (weekend coverage)

## 2013-10-08 NOTE — Progress Notes (Signed)
Subjective: Dr. Eliane [name] assistance appreciated Diane [name] is awake, HOH, a little confused, in no distress  Objective: Lab:  Recent Labs  10/06/13 1157  WBC 9.4  NEUTROABS 6.8  HGB 12.4  HCT 36.6  MCV 89.1  PLT 153    Recent Labs  10/07/13 0847 10/07/13 1314 10/08/13 0530  NA 126* 128* 127*  K 4.3 3.9 3.7  CL 85* 88* 89*  GLUCOSE 109* 100* 104*  BUN 35* 33* 32*  CREATININE 1.08 1.04 1.08  CALCIUM 8.3* 7.9* 8.2*  PHOS 3.9 3.7 3.7   Uosm 441, Urine K 35, K < 10  Imaging: Abdome 1 view: IMPRESSION:  1. Subtle changes suggesting abdominal aortic aneurysm. Aortic  ultrasound suggested for further evaluation.  2. Large amount of stool in colon. No bowel distention.  3. Cardiomegaly with small pleural effusions.  Scheduled Meds: . aspirin EC  81 mg Oral Daily  . cefUROXime  250 mg Oral BID WC  . docusate sodium  100 mg Oral QHS  . enoxaparin (LOVENOX) injection  30 mg Subcutaneous Q24H  . feeding supplement (ENSURE COMPLETE)  237 mL Oral TID BM  . furosemide  40 mg Intravenous Q12H  . loratadine  10 mg Oral Daily  . multivitamin with minerals  1 tablet Oral Daily  . pantoprazole  80 mg Oral Daily  . polyethylene glycol  17 g Oral Daily  . propranolol  20 mg Oral BID  . sodium chloride  3 mL Intravenous Q12H  . tamsulosin  0.4 mg Oral Daily   Continuous Infusions: . sodium chloride 50 mL/hr at 10/08/13 0640   PRN Meds:.acetaminophen, acetaminophen, albuterol, bisacodyl, ondansetron (ZOFRAN) IV, ondansetron   Physical Exam: Filed Vitals:   10/08/13 0626  BP: 100/65  Pulse: 96  Temp: 96.9 F (36.1 C)  Resp: 17   gen'l- Elderly white woman in no distress. HOH Cor - 2+ radial RRR, III/VI systolic mm Pulm - normal respirations. Feint crackles left base. Abd - soft.     Assessment/Plan: 1. ID - no evidence active infection  2. Metabolic - Na 127. Nephrology assisting Plan D/c NS after hanging liter completed  BMet in AM  3. Cardiac     No  clinical signs of decompensation.  4. GU- good UOP  5. GI - stool on x-ray. Plan Continue miralax.  dispo - if Na remains stable will try for SNF transfer tomorrow.    Diane [name] IM (o) [phone]; (c) [phone] Call-grp - Diane [name] IM  Tele: [phone]  10/08/2013, 10:19 AM

## 2013-10-09 LAB — RENAL FUNCTION PANEL
Albumin: 2.7 g/dL — ABNORMAL LOW (ref 3.5–5.2)
BUN: 31 mg/dL — ABNORMAL HIGH (ref 6–23)
Chloride: 88 mEq/L — ABNORMAL LOW (ref 96–112)
GFR calc Af Amer: 53 mL/min — ABNORMAL LOW (ref >90)
GFR calc non Af Amer: 46 mL/min — ABNORMAL LOW (ref >90)
Phosphorus: 3.3 mg/dL (ref 2.3–4.6)
Potassium: 4.1 mEq/L (ref 3.5–5.1)
Sodium: 126 mEq/L — ABNORMAL LOW (ref 135–145)

## 2013-10-09 MED ORDER — FUROSEMIDE 40 MG PO TABS
40.0000 mg | ORAL_TABLET | Freq: Two times a day (BID) | ORAL | Status: DC
  Administered 2013-10-09: 09:00:00 40 mg via ORAL
  Filled 2013-10-09 (×3): qty 1

## 2013-10-09 MED ORDER — FUROSEMIDE 10 MG/ML IJ SOLN
40.0000 mg | Freq: Two times a day (BID) | INTRAMUSCULAR | Status: AC
  Administered 2013-10-09 – 2013-10-10 (×2): 40 mg via INTRAVENOUS
  Filled 2013-10-09: qty 4

## 2013-10-09 NOTE — Progress Notes (Signed)
Subjective:  Drinking ensure and 2 cups of water at the bedside! Says "I keep a glass with me all the time" Very hard of hearing but pleasant  Objective:     Vital signs in last 24 hours: Filed Vitals:   10/08/13 2136 10/09/13 0234 10/09/13 0526 10/09/13 1116  BP: 88/55 94/60 99/63  104/70  Pulse:   86 94  Temp: 98 F (36.7 C)  98.3 F (36.8 C)   TempSrc: Rectal  Rectal   Resp: 16 18 20    Height:      Weight:   64.69 kg (142 lb 9.9 oz)   SpO2: 98% 97% 96%    Weight change: 2.99 kg (6 lb 9.5 oz)  Intake/Output Summary (Last 24 hours) at 10/09/13 1242 Last data filed at 10/09/13 0500  Gross per 24 hour  Intake   1190 ml  Output    375 ml  Net    815 ml    Physical Exam:  Blood pressure 104/70, pulse 94, temperature 98.3 F (36.8 C), temperature source Rectal, resp. rate 20, height 5\' 2"  (1.575 m), weight 64.69 kg (142 lb 9.9 oz), SpO2 96.00%. Frail elderly WF NAD +JVD S1S2 No S3 Lungs with crackles both bases Abd soft without focal tenderness Bilat LE edema 2+  Results for Diane, [name] (MRN [id number]) as of 10/09/2013 12:44  Ref. Range 10/03/2013 08:00  Cortisol - AM Latest Range: 4.3-22.4 ug/dL 40.9 (H)  Results for Diane, [name] (MRN [id number]) as of 10/09/2013 12:44  Ref. Range 01/05/2013 13:50  TSH Latest Range: 0.35-5.50 uIU/mL 2.72    Recent Labs Lab 10/04/13 0843 10/05/13 0547 10/06/13 1157 10/07/13 0847 10/07/13 1314 10/08/13 0530 10/09/13 0855  NA 124* 126* 121* 126* 128* 127* 126*  K 4.1 4.9 4.2 4.3 3.9 3.7 4.1  CL 86* 85* 85* 85* 88* 89* 88*  CO2 27 26 25 26 25 26 25   GLUCOSE 109* 97 119* 109* 100* 104* 116*  BUN 42* 40* 38* 35* 33* 32* 31*  CREATININE 1.08 1.14* 1.03 1.08 1.04 1.08 1.06  CALCIUM 8.2* 8.3* 8.2* 8.3* 7.9* 8.2* 8.2*  PHOS  --   --   --  3.9 3.7 3.7 3.3     Recent Labs Lab 10/07/13 1314 10/08/13 0530 10/09/13 0855  ALBUMIN 2.6* 2.5* 2.7*   No results found for this basename: LIPASE, AMYLASE,  in the last 168  hours No results found for this basename: AMMONIA,  in the last 168 hours   Recent Labs Lab 10/03/13 0615 10/05/13 0358 10/06/13 1157  WBC 11.4* 11.6* 9.4  NEUTROABS 7.5  --  6.8  HGB 12.3 12.0 12.4  HCT 35.8* 35.0* 36.6  MCV 88.8 89.7 89.1  PLT 107* 134* 153   Studies/Results: Dg Chest Port 1 View  10/08/2013   CLINICAL DATA:  Shortness of breath.  History of CHF.  EXAM: PORTABLE CHEST - 1 VIEW  COMPARISON:  10/02/2013  FINDINGS: Moderate cardiomegaly with atherosclerosis in the transverse aorta. Probable small bilateral pleural effusions, similar. Right infrahilar opacity is similar to minimally increased.  IMPRESSION: Slight increase in moderate congestive heart failure.  Persistent right infrahilar opacity, for which concurrent infection cannot be excluded. Consider short term radiographic followup.   Electronically Signed   By: Jeronimo Greaves M.D.   On: 10/08/2013 23:10   Medications:   . aspirin EC  81 mg Oral Daily  . cefUROXime  250 mg Oral BID WC  . docusate sodium  100 mg Oral QHS  .  enoxaparin (LOVENOX) injection  40 mg Subcutaneous Q24H  . feeding supplement (ENSURE COMPLETE)  237 mL Oral TID BM  . furosemide  40 mg Oral BID  . loratadine  10 mg Oral Daily  . multivitamin with minerals  1 tablet Oral Daily  . pantoprazole  80 mg Oral Daily  . polyethylene glycol  17 g Oral Daily  . propranolol  20 mg Oral BID  . sodium chloride  3 mL Intravenous Q12H  . tamsulosin  0.4 mg Oral Daily     ASSESSMENT/RECOMMENDATIONS Assessment/Plan:  1. Hyponatremia-acute worsening of chronic hyponatremia. I suspect that her baseline hyponatremia is due to multiple etiologies including her history of congestive heart failure, hypoalbuminemia/malnutrition and possibly a reset osmostat.  Currently appears to be a hypervolemic hyponatremia with evidence of vol xs on exam and CXR.  On furosemide 40 po BID - still with edema and net + balance past 24 hours.  Change to 40 IV BID for 2  doses, then back to po. REINFORCE FLUID RESTRICTION (current orders say "encourage po fluids" and she has 2 cups of water at her bedside) 2. Urinary tract infection: Improving-she is asymptomatic/afebrile.  3. Malnutrition: Poor albumin noted-this may be a reflection of the acute infection/inflammatory reaction versus chronic malnutrition-we'll start her on an oral nutrition supplement.  4. History of congestive heart failure: Chronic and currently does not appear to be decompensated. On diuretic. Change to IV for 24 hours as above.     Camille Bal, MD [facility] [phone] Pager 10/09/2013, 12:42 PM

## 2013-10-09 NOTE — Progress Notes (Signed)
Subjective: Yesterday patient c/o SOB - ABG reveal PO2 45, PCO2 38. CXR was abnormal. She voices c/o dry mouth and also intermittently being SOB. No pain or other complaints.   Objective: Lab:  Recent Labs  10/06/13 1157  WBC 9.4  NEUTROABS 6.8  HGB 12.4  HCT 36.6  MCV 89.1  PLT 153    Recent Labs  10/07/13 0847 10/07/13 1314 10/08/13 0530  NA 126* 128* 127*  K 4.3 3.9 3.7  CL 85* 88* 89*  GLUCOSE 109* 100* 104*  BUN 35* 33* 32*  CREATININE 1.08 1.04 1.08  CALCIUM 8.3* 7.9* 8.2*  PHOS 3.9 3.7 3.7   ABG 7.42/38/45 Cortisol 11/29 = 24.3 Imaging: CXR 11/30: IMPRESSION:  Slight increase in moderate congestive heart failure.  Persistent right infrahilar opacity, for which concurrent infection  cannot be excluded. Consider short term radiographic followup.  Scheduled Meds: . aspirin EC  81 mg Oral Daily  . cefUROXime  250 mg Oral BID WC  . docusate sodium  100 mg Oral QHS  . enoxaparin (LOVENOX) injection  40 mg Subcutaneous Q24H  . feeding supplement (ENSURE COMPLETE)  237 mL Oral TID BM  . furosemide  40 mg Intravenous Q12H  . furosemide  40 mg Oral Daily  . loratadine  10 mg Oral Daily  . multivitamin with minerals  1 tablet Oral Daily  . pantoprazole  80 mg Oral Daily  . polyethylene glycol  17 g Oral Daily  . propranolol  20 mg Oral BID  . sodium chloride  3 mL Intravenous Q12H  . tamsulosin  0.4 mg Oral Daily   Continuous Infusions:  PRN Meds:.acetaminophen, acetaminophen, albuterol, bisacodyl, ondansetron (ZOFRAN) IV, ondansetron   Physical Exam: Filed Vitals:   10/09/13 0526  BP: 99/63  Pulse: 86  Temp: 98.3 F (36.8 C)  Resp: 20  O2 sat 96%  Intake/Output Summary (Last 24 hours) at 10/09/13 0737 Last data filed at 10/09/13 0500  Gross per 24 hour  Intake 1311.67 ml  Output    500 ml  Net 811.67 ml   Total this adm: + 3, 138 Weight 142 lbs up 6 lbs  gen'l - elderly woman in no acute distress but does complain of being short of  breath\ HEENT - [place]/AT, C&S clear Cor 2+ radial , RRR, no JVD Pulm - bibasilar rales w/o wheezing Abd - BS + soft, no guarding or rebound Neuro - awake, seems alert, follows commands      Assessment/Plan: 1. ID - resolved  2.Metabolic - Na 127. No adrenal insufficiency, urine osmolality and lytes c/w pure hyponatremia  Plan Fluid restriction  3. Cardiac - since yesterday - abnormal ABG with low PO2, CXR with increased failure, rales on exam Plan Fluid restrict  Continue with PO furosemide - bid  F/u CXR in AM  4. GU no report of retention. Non-tender abdomen  5. GI  Last recorded BM 11/28. She is on miralax  dispo - transfer to SNF delayed due to acute CHF. Called her daughter   Diane [name] IM (o) [phone]; (c) [phone] Call-grp - Diane [name] IM  Tele: [phone]  10/09/2013, 7:31 AM

## 2013-10-10 ENCOUNTER — Inpatient Hospital Stay (HOSPITAL_COMMUNITY): Payer: Medicare Other

## 2013-10-10 LAB — RENAL FUNCTION PANEL
BUN: 32 mg/dL — ABNORMAL HIGH (ref 6–23)
CO2: 25 mEq/L (ref 19–32)
GFR calc Af Amer: 54 mL/min — ABNORMAL LOW (ref >90)
GFR calc non Af Amer: 46 mL/min — ABNORMAL LOW (ref >90)
Glucose, Bld: 107 mg/dL — ABNORMAL HIGH (ref 70–99)
Potassium: 4 mEq/L (ref 3.5–5.1)
Sodium: 127 mEq/L — ABNORMAL LOW (ref 135–145)

## 2013-10-10 MED ORDER — MAGNESIUM HYDROXIDE 400 MG/5ML PO SUSP
15.0000 mL | ORAL | Status: DC
  Administered 2013-10-10 – 2013-10-11 (×8): 15 mL via ORAL
  Filled 2013-10-10 (×24): qty 30

## 2013-10-10 MED ORDER — FUROSEMIDE 10 MG/ML IJ SOLN: 40.0000 mg | Freq: Two times a day (BID) | INTRAMUSCULAR | Status: DC

## 2013-10-10 MED ORDER — FUROSEMIDE 10 MG/ML IJ SOLN
40.0000 mg | Freq: Two times a day (BID) | INTRAMUSCULAR | Status: DC
  Administered 2013-10-10 – 2013-10-12 (×5): 40 mg via INTRAVENOUS
  Filled 2013-10-10 (×8): qty 4

## 2013-10-10 NOTE — Progress Notes (Signed)
Read, reviewed, and agree.  Tamikka Pilger B. Glennys Schorsch, PT, DPT #[phone]  

## 2013-10-10 NOTE — Progress Notes (Signed)
Subjective:  Says she is "sick at her stomach" IV lasix - UOP either minimal or not recorded  Objective:     Vital signs in last 24 hours: Filed Vitals:   10/09/13 1116 10/09/13 1303 10/09/13 2127 10/10/13 0404  BP: 104/70 94/63 101/68 90/58  Pulse: 94 95 86 66  Temp:  98.4 F (36.9 C) 97.6 F (36.4 C) 97.9 F (36.6 C)  TempSrc:  Oral Oral Oral  Resp:  20 20 18   Height:      Weight:    64.1 kg (141 lb 5 oz)  SpO2:  100% 100% 95%   Weight change: -0.59 kg (-1 lb 4.8 oz)  Intake/Output Summary (Last 24 hours) at 10/10/13 0934 Last data filed at 10/10/13 0848  Gross per 24 hour  Intake      0 ml  Output    255 ml  Net   -255 ml    Physical Exam:  Blood pressure 90/58, pulse 66, temperature 97.9 F (36.6 C), temperature source Oral, resp. rate 18, height 5\' 2"  (1.575 m), weight 64.1 kg (141 lb 5 oz), SpO2 95.00%. Frail elderly WF NAD +JVD S1S2 No S3 Lungs with crackles both bases Abd soft without focal tenderness Bilat LE edema 2+ unchanged  Results for Diane, [name] (MRN [id number]) as of 10/09/2013 12:44  Ref. Range 10/03/2013 08:00  Cortisol - AM Latest Range: 4.3-22.4 ug/dL 40.9 (H)  Results for Diane [name] (MRN [id number]) as of 10/09/2013 12:44  Ref. Range 01/05/2013 13:50  TSH Latest Range: 0.35-5.50 uIU/mL 2.72    Recent Labs Lab 10/05/13 0547 10/06/13 1157 10/07/13 0847 10/07/13 1314 10/08/13 0530 10/09/13 0855 10/10/13 0450  NA 126* 121* 126* 128* 127* 126* 127*  K 4.9 4.2 4.3 3.9 3.7 4.1 4.0  CL 85* 85* 85* 88* 89* 88* 90*  CO2 26 25 26 25 26 25 25   GLUCOSE 97 119* 109* 100* 104* 116* 107*  BUN 40* 38* 35* 33* 32* 31* 32*  CREATININE 1.14* 1.03 1.08 1.04 1.08 1.06 1.05  CALCIUM 8.3* 8.2* 8.3* 7.9* 8.2* 8.2* 8.4  PHOS  --   --  3.9 3.7 3.7 3.3 3.5     Recent Labs Lab 10/08/13 0530 10/09/13 0855 10/10/13 0450  ALBUMIN 2.5* 2.7* 2.6*   No results found for this basename: LIPASE, AMYLASE,  in the last 168 hours No results found for  this basename: AMMONIA,  in the last 168 hours   Recent Labs Lab 10/05/13 0358 10/06/13 1157  WBC 11.6* 9.4  NEUTROABS  --  6.8  HGB 12.0 12.4  HCT 35.0* 36.6  MCV 89.7 89.1  PLT 134* 153   Studies/Results: Dg Chest Port 1 View  10/08/2013   CLINICAL DATA:  Shortness of breath.  History of CHF.  EXAM: PORTABLE CHEST - 1 VIEW  COMPARISON:  10/02/2013  FINDINGS: Moderate cardiomegaly with atherosclerosis in the transverse aorta. Probable small bilateral pleural effusions, similar. Right infrahilar opacity is similar to minimally increased.  IMPRESSION: Slight increase in moderate congestive heart failure.  Persistent right infrahilar opacity, for which concurrent infection cannot be excluded. Consider short term radiographic followup.   Electronically Signed   By: Jeronimo Greaves M.D.   On: 10/08/2013 23:10   Medications:   . aspirin EC  81 mg Oral Daily  . docusate sodium  100 mg Oral QHS  . enoxaparin (LOVENOX) injection  40 mg Subcutaneous Q24H  . feeding supplement (ENSURE COMPLETE)  237 mL Oral TID BM  . furosemide  40 mg Intravenous Q12H  . loratadine  10 mg Oral Daily  . magnesium hydroxide  15 mL Oral Q4H  . multivitamin with minerals  1 tablet Oral Daily  . pantoprazole  80 mg Oral Daily  . propranolol  20 mg Oral BID  . sodium chloride  3 mL Intravenous Q12H  . tamsulosin  0.4 mg Oral Daily     ASSESSMENT/RECOMMENDATIONS Assessment/Plan:   Hyponatremia Stable around 127 Hypervolemic component now on top of chronic multifactorial (baseline hyponatremia is due to multiple etiologies including her history of congestive heart failure, hypoalbuminemia/malnutrition and possibly a reset osmostat).  Needs additional diuresis for CHF Would continue with IV lasix at this time as she still has JVD, rales and peripheral edema (I went ahead and reordered without a stop date to allow for daily reassessment)  Since Na stable and pt just needing additional diuresis at this point,  will sign off.  Please call if can assist further.  Camille Bal, MD [facility] [phone] Pager 10/10/2013, 9:34 AM

## 2013-10-10 NOTE — Progress Notes (Signed)
PT Cancellation Note  Patient Details Name: SHLOKA BALDRIDGE MRN: [id number] DOB: [birth year]   Cancelled Treatment:    Reason Eval/Treat Not Completed: Medical issues which prohibited therapy (Pt feeling very sick today and not well enough to participate in any sort of therapy.  Reports she is willing to try tomorrow after she rests today)   Barrie Dunker 10/10/2013, 3:51 PM

## 2013-10-10 NOTE — Progress Notes (Signed)
Subjective: Diane [name] reports she is feeling bad, weak. She denies frank SOB  Objective: Lab: No results found for this basename: WBC, NEUTROABS, HGB, HCT, MCV, PLT,  in the last 72 hours  Recent Labs  10/08/13 0530 10/09/13 0855 10/10/13 0450  NA 127* 126* 127*  K 3.7 4.1 4.0  CL 89* 88* 90*  GLUCOSE 104* 116* 107*  BUN 32* 31* 32*  CREATININE 1.08 1.06 1.05  CALCIUM 8.2* 8.2* 8.4  PHOS 3.7 3.3 3.5    Imaging:  Scheduled Meds: . aspirin EC  81 mg Oral Daily  . cefUROXime  250 mg Oral BID WC  . docusate sodium  100 mg Oral QHS  . enoxaparin (LOVENOX) injection  40 mg Subcutaneous Q24H  . feeding supplement (ENSURE COMPLETE)  237 mL Oral TID BM  . loratadine  10 mg Oral Daily  . multivitamin with minerals  1 tablet Oral Daily  . pantoprazole  80 mg Oral Daily  . polyethylene glycol  17 g Oral Daily  . propranolol  20 mg Oral BID  . sodium chloride  3 mL Intravenous Q12H  . tamsulosin  0.4 mg Oral Daily   Continuous Infusions:  PRN Meds:.acetaminophen, acetaminophen, albuterol, bisacodyl, ondansetron (ZOFRAN) IV, ondansetron   Physical Exam: Filed Vitals:   10/10/13 0404  BP: 90/58  Pulse: 66  Temp: 97.9 F (36.6 C)  Resp: 18   Gen'l - frail elderly woman in no distress HEENT- C&S clear, MM dry Cor - 2+ radial pulse, quiet precordium II/VI mm best at RSB but across the precordium Pulm - bibasilar rales, no increased WOB, no wheezing Abd- soft. Neuro - awake, seems alert      Assessment/Plan: 1. ID - resolved and stable  2. Metabolic - Na 128. Chronic hyponatremia that is multi=-factorial  3. Cardiac - BP stable. UOP after IV lasix is poor 175 cc out. Still sounds wet.   Plan - 2 additional doses IV lasix to diurese her.  4. GU - no foley. Patient is not uncomfortable Plan Bladder scan if residual greater than 200 cc will I&O cath  5. GI - no BM recorded for > 48 hrs, despite daily miralax. Plan -  D/c miralax  MOM 30 cc q 4 hours until  BM  Dispo - NOT medically stable for d/c due to persistent CHF    [employer] IM (o) [phone]; (c) [phone] Call-grp - [name] IM  Tele: [phone]  10/10/2013, 7:38 AM

## 2013-10-10 NOTE — Progress Notes (Signed)
Pt c/o being nauseated, given PRN Zofran. Will continue to monitor.

## 2013-10-11 DIAGNOSIS — K5900 Constipation, unspecified: Secondary | ICD-10-CM

## 2013-10-11 LAB — PRO B NATRIURETIC PEPTIDE: Pro B Natriuretic peptide (BNP): 25517 pg/mL — ABNORMAL HIGH (ref 0–450)

## 2013-10-11 LAB — RENAL FUNCTION PANEL
Albumin: 2.7 g/dL — ABNORMAL LOW (ref 3.5–5.2)
CO2: 23 mEq/L (ref 19–32)
Chloride: 88 mEq/L — ABNORMAL LOW (ref 96–112)
Creatinine, Ser: 1.24 mg/dL — ABNORMAL HIGH (ref 0.50–1.10)
GFR calc Af Amer: 44 mL/min — ABNORMAL LOW (ref >90)
GFR calc non Af Amer: 38 mL/min — ABNORMAL LOW (ref >90)
Phosphorus: 3.4 mg/dL (ref 2.3–4.6)
Potassium: 4.5 mEq/L (ref 3.5–5.1)

## 2013-10-11 MED ORDER — ENOXAPARIN SODIUM 30 MG/0.3ML ~~LOC~~ SOLN
30.0000 mg | SUBCUTANEOUS | Status: DC
  Administered 2013-10-11 – 2013-10-13 (×3): 30 mg via SUBCUTANEOUS
  Filled 2013-10-11 (×3): qty 0.3

## 2013-10-11 NOTE — Progress Notes (Signed)
Pt was bladder scanned. Only 50 ml found in her bladder. Will continue to monitor pt per MD order.

## 2013-10-11 NOTE — Clinical Social Work Note (Signed)
Per RNCM patient is still not stable for DC. Patient has bed at [facility] once she discharges.  Roddie Mc, [employer], [place], [phone]

## 2013-10-11 NOTE — Progress Notes (Signed)
Pt had large light brown liquid stool. Also had formed stool as well. Will continue to monitor pt.

## 2013-10-11 NOTE — Progress Notes (Signed)
PT Cancellation Note  Patient Details Name: Diane [name] MRN: [id number] DOB: 10/23/1924   Cancelled Treatment:    Reason Eval/Treat Not Completed: Medical issues which prohibited therapy (pt states she feels nauseated and tired, unwilling to attempt PT)  Attempted to treat pt x 2, pt c/o stomach upset and fatigue, unwilling to participate in any PT, even at bed level.   Jhamir Pickup 10/11/2013, 1:43 PM

## 2013-10-11 NOTE — Progress Notes (Signed)
Subjective: Diane [name] c/o abdominal pain.She denies chest pain or shortness of breath.  Objective: Lab: No results found for this basename: WBC, NEUTROABS, HGB, HCT, MCV, PLT,  in the last 72 hours  Recent Labs  10/09/13 0855 10/10/13 0450 10/11/13 0639  NA 126* 127* 125*  K 4.1 4.0 4.5  CL 88* 90* 88*  GLUCOSE 116* 107* 101*  BUN 31* 32* 37*  CREATININE 1.06 1.05 1.24*  CALCIUM 8.2* 8.4 8.3*  PHOS 3.3 3.5 3.4    Imaging: 10/10/13 2 view CXR: CHEST 2 VIEW  COMPARISON: 10/08/2013 and prior chest radiographs dating back to  01/26/2003  FINDINGS:  Cardiomegaly with pulmonary vascular congestion noted.  Mild interstitial opacities are suggestive of interstitial edema.  Small bilateral pleural effusions and bilateral lower lung opacities  are present.  There is no evidence of pneumothorax or acute bony abnormality.  IMPRESSION:  Cardiomegaly, pulmonary vascular congestion with probable mild  interstitial pulmonary edema.  Small bilateral pleural effusions with bibasilar opacities - favor  atelectasis over airspace disease/pneumonia.  Scheduled Meds: . aspirin EC  81 mg Oral Daily  . docusate sodium  100 mg Oral QHS  . enoxaparin (LOVENOX) injection  40 mg Subcutaneous Q24H  . feeding supplement (ENSURE COMPLETE)  237 mL Oral TID BM  . furosemide  40 mg Intravenous Q12H  . loratadine  10 mg Oral Daily  . magnesium hydroxide  15 mL Oral Q4H  . multivitamin with minerals  1 tablet Oral Daily  . pantoprazole  80 mg Oral Daily  . propranolol  20 mg Oral BID  . sodium chloride  3 mL Intravenous Q12H  . tamsulosin  0.4 mg Oral Daily   Continuous Infusions:  PRN Meds:.acetaminophen, acetaminophen, albuterol, bisacodyl, ondansetron (ZOFRAN) IV, ondansetron   Physical Exam: Filed Vitals:   10/11/13 0514  BP: 103/69  Pulse: 78  Temp: 97.5 F (36.4 C)  Resp: 16    Intake/Output Summary (Last 24 hours) at 10/11/13 0819 Last data filed at 10/11/13 0514  Gross per 24  hour  Intake    680 ml  Output    475 ml  Net    205 ml  wt 141  From  141.5 from 142.9. Base weight 136 Gen'l - elderly woman in no acute distress HEENT - C&S clear Cor 2+ radial, Irregular rhythm, soft mm PUlm - bibasilar rales-mild, using abdominal muscles for respiration. Abd - BS+, no guarding or rebound, no mass, no acute tenderness to palpation. Neuro - awake and conversant, follows commands.        Assessment/Plan: 1. ID - resolved. No signs of infection  2. Metabolic - Na 125. Nephrology opines this is a chronic state that is multifactorial.  3. Cardiac - CXR 12/2  With vascular congestion/edema. Exam is better but still with rales. BNP pending. No note in progress section re: bladder scan.   Plan Continue IV lasix  Will ask for documentation of bladder emptying  Will ask for BNP to be run  4. GU - low urine output for amount of lasix given. Will ask for documentation of out put.  5. GI - no recorded BM since 11/28 despite miralax and then MOM.  Plan Continue MOM,   Will have patient checked for impaction.   [name] [place] [facility] (o) [phone]; (c) [phone] Call-grp - [name] IM  Tele: [phone]  10/11/2013, 8:17 AM

## 2013-10-12 MED ORDER — ENSURE COMPLETE PO LIQD: 237.0000 mL | Freq: Three times a day (TID) | ORAL | Status: AC

## 2013-10-12 MED ORDER — TAMSULOSIN HCL 0.4 MG PO CAPS: 0.4000 mg | ORAL_CAPSULE | Freq: Every day | ORAL | Status: AC

## 2013-10-12 MED ORDER — ACETAMINOPHEN 325 MG PO TABS: 650.0000 mg | ORAL_TABLET | Freq: Four times a day (QID) | ORAL | Status: AC | PRN

## 2013-10-12 NOTE — Progress Notes (Signed)
Notified Dr. Raelyn Mora that patient's BP is 86/58 HR 81. MD gave order to hold tonight's 2200 dose of inderal and Lasix. Patient is asymptomatic. Will continue to monitor patient. Nelda Marseille, RN

## 2013-10-12 NOTE — Progress Notes (Signed)
Pt manual BP 70/45, MD called, no answer and voicemail left. Will continue to monitor pt.

## 2013-10-12 NOTE — Clinical Social Work Note (Signed)
Per RN patient not stable for DC to SNF. CSW will continue to follow.  Diane [name], [place], [place], [phone]

## 2013-10-12 NOTE — Progress Notes (Signed)
Pt's BP 99/57 and HR 83.  Called Dr. Debby Bud about patient's VS and asked whether to hold or give scheduled propranolol and lasix.  Dr. Debby Bud said it was okay to give.  Will continue to monitor pt.

## 2013-10-12 NOTE — Progress Notes (Signed)
Report given do Dianna at [facility].  Awaiting transport.

## 2013-10-12 NOTE — Progress Notes (Signed)
Subjective: Reviewed nursing notes: patient did have liguid and formed stool 12/3. Bladder scan revealed only 50 cc residual.  Patient in no distress, in particular no respiratory distress  Objective: Lab: No results found for this basename: WBC, NEUTROABS, HGB, HCT, MCV, PLT,  in the last 72 hours  Recent Labs  10/09/13 0855 10/10/13 0450 10/11/13 0639  NA 126* 127* 125*  K 4.1 4.0 4.5  CL 88* 90* 88*  GLUCOSE 116* 107* 101*  BUN 31* 32* 37*  CREATININE 1.06 1.05 1.24*  CALCIUM 8.2* 8.4 8.3*  PHOS 3.3 3.5 3.4  BNP 10/11/13   25517 (12259)  Imaging:  Scheduled Meds: . aspirin EC  81 mg Oral Daily  . docusate sodium  100 mg Oral QHS  . enoxaparin (LOVENOX) injection  30 mg Subcutaneous Q24H  . feeding supplement (ENSURE COMPLETE)  237 mL Oral TID BM  . furosemide  40 mg Intravenous Q12H  . loratadine  10 mg Oral Daily  . magnesium hydroxide  15 mL Oral Q4H  . multivitamin with minerals  1 tablet Oral Daily  . pantoprazole  80 mg Oral Daily  . propranolol  20 mg Oral BID  . sodium chloride  3 mL Intravenous Q12H  . tamsulosin  0.4 mg Oral Daily   Continuous Infusions:  PRN Meds:.acetaminophen, acetaminophen, albuterol, bisacodyl, ondansetron (ZOFRAN) IV, ondansetron   Physical Exam: Filed Vitals:   10/12/13 0530  BP: 105/68  Pulse: 80  Temp: 97.6 F (36.4 C)  Resp: 18    Intake/Output Summary (Last 24 hours) at 10/12/13 0748 Last data filed at 10/12/13 0321  Gross per 24 hour  Intake    360 ml  Output    175 ml  Net    185 ml  total this adm: +3,323 Wt 140 (143 12/3)  See d/c summar     Assessment/Plan: Patient is relatively stable with multiple chronic problems and is ready for transfer to SNF. Guarded prognosis. Priority d/c dictated [id number].  Tried calling daughter - left message on answering machine   [name] [place] [facility] (o) [phone]; (c) [phone] Call-grp - [name] IM  Tele: [phone]  10/12/2013, 7:47 AM

## 2013-10-12 NOTE — Discharge Summary (Signed)
Diane [name], Diane [name]                ACCOUNT NO.:  [id number]  MEDICAL RECORD NO.:  [id number]  LOCATION:  5W14C                        FACILITY:  [facility]  PHYSICIAN:  Rosalyn Gess. Achille Xiang, MD  DATE OF BIRTH:  [birth year]  DATE OF ADMISSION:  09/27/2013 DATE OF DISCHARGE:  10/12/2013                              DISCHARGE SUMMARY   ADMITTING DIAGNOSES: 1. Urinary tract infection. 2. Gastroesophageal reflux disease. 3. Diastolic heart failure, class 4. 4. Hypertension. 5. Hyperkalemia. 6. Dehydration with hyponatremia. 7. Failure to thrive.  DISCHARGE DIAGNOSES: 1. Urinary tract infection. 2. Gastroesophageal reflux disease. 3. Diastolic heart failure, class 4. 4. Hypertension. 5. Hyperkalemia. 6. Dehydration with hyponatremia. 7. Failure to thrive. 8. Community-acquired pneumonia.  CONSULTANTS:  Dr. [facility] for Nephrology.  PROCEDURES:  Imaging: 1. Chest x-ray, September 27, 2013, which showed cardiomegaly with     underlying chronic interstitial coarsening.  Patchy alveolar     opacity at the right base, atelectasis versus pneumonia. 2. Chest x-ray, September 28, 2013, with the appearance of chest     suggesting mild congestive heart failure.  Ill-defined opacity in     the right lower lung may represent developing bronchopneumonia in     either the right middle or lower lobe or could indicate sequelae of     aspiration.  Subsegmental atelectasis is also a possible diagnosis. 3. Chest x-ray, September 30, 2013, consolidation of bilateral lung     suspicious for pneumonia.  Probable small bilateral pleural     effusions.  Mild congestive heart failure. 4. Chest x-ray, October 02, 2013.  Findings consistent with improving     congestive heart failure. 5. October 07, 2013, abdomen 1 view, which showed subtle changes     suggesting of abdominal aortic aneurysm.  Aortic ultrasound could     be followed up.  Large bowel stool in the colon.  Cardiomegaly with    small pleural effusions. 6. Chest x-ray, October 08, 2013, slight increase in moderate     congestive heart failure.  Persistent right infrahilar opacity for     which concurrent infection cannot be excluded. 7. Chest x-ray, October 10, 2013, with cardiopulmonary vascular     congestion with probable mild interstitial pulmonary edema.  Small     bilateral pleural effusions, favoring atelectasis over airspace     disease. 8. Echocardiogram performed September 29, 2013, read as showing normal     left ventricle with an EF of 60-65% which was thought to be pseudo     normal with a left filling pattern concomitant with abnormal     relaxation and increased filling pressures, grade 2 diastolic     dysfunction.  Aortic valve with moderate regurgitation.  Mitral     valve with calcified anulus and severe regurgitation.  Left atrium     moderately dilated.  Right ventricle mildly dilated, with systolic     pressure increased.  Tricuspid valve with moderate regurgitation.  HISTORY OF PRESENT ILLNESS:  Ms. Schum is an 79 year old followed for dementia, GERD, hyperlipidemia, recurrent cystitis, chronic diastolic heart failure, who had been a hospice patient several years ago.  The patient is hard of hearing.  She currently had been living alone with people coming into cook and clean for her.  She presented to the emergency department with a history of nausea and weakness.  The patient was unable to provide history secondary to altered mental status.  The history was provided by family at the bedside.  Family reported the patient has been declining over the last 2-3 years but more so in the last several months.  She has been having persistent nausea without vomiting, abdominal pain, fevers or chills.  The patient has had some spotting of blood on her underwear.  She reported no dysuria or urinary frequency.  The patient has complaints of dyspnea.  The patient has had mild leg edema.  The patient  has had progressive confusion.  The patient's mobility has been limited, being able to get from chair to bed and back.  In the emergency department, the patient was noted to have a sodium of 129, creatinine 1.27.  UA was positive.  EKG without changes. The patient was subsequently admitted for care.  Please see the H and P for complete admission examination, past medical history, family history, and social history.  HOSPITAL COURSE: 1. ID.  The patient was treated for both the UTI as well as the     community-acquired pneumonia with IV antibiotics.  She did well     with normalization of her white blood count with no active signs of     infection.  She has been off antibiotics for greater than 48 hours     at the time of discharge dictation. 2. Metabolic.  The patient had persistent hyponatremia to a low of     121.  She had initially been given normal saline, with minimal     improvement but probable fluid overload.  The patient was seen in     consultation by the Nephrology Service who agreed with fluid     restriction.  The patient was felt to have hyponatremia that was     chronic and multifactorial related to medications, heart failure,     diuretics.  The patient did receive another round of normal saline     by IV with her sodium rising to a high of 128.  IV fluids were then     discontinued and the patient's sodium is continued to drift down     with the last sodium being 125.  Nephrology felt that the patient     had a chronic hyponatremia that is multifactorial related to heart     failure, malnutrition, and probably a reset osmostat .  The patient     was not felt to require any additional nephrology treatment and     recommendations were to continue on a fluid restriction.  The     patient's urine output has been low, but she has no residuals.  Her     renal function has remained stable with a stable creatinine. 3. Cardiac.  The patient with a history of diastolic  dysfunction.  A     2D echo from September 29, 2013, is reviewed and is as stated above.     She does have grade 2 diastolic dysfunction.  With runs of normal     saline, the patient has had a problem with fluid overload, but she     has been adequately diuresed with both p.o. and most recently IV     Lasix.  At this time, she does have persistent mild rales, but she  has no increased work of breathing.  Her oxygen saturation is good     on 1 L to 2 L of O2 by nasal cannula and she is in no distress.  At     this point, further IV diuretics would seem to be unnecessary and     she will be discharged on oral Lasix.  She will need to have     Cardiology follow up.  Last BNP on October 11, 2013, was elevated     at 25,517.  The patient overall has a poor cardiac prognosis given     her advanced age, diastolic heart failure, and mitral valve and     other valvular disease.  Plan is to continue her oral medications     and oral Lasix with followup as noted. 4. GU.  The patient has had some problems with urinary retention this     admission and was started on tamsulosin.  Per last bladder scan on     October 11, 2013, she had no significant residuals and was able to     empty her bladder satisfactorily.  At this point, the patient will     be able to be discharged.  She does not need a Foley catheter.  She     does need to have monitoring to make sure she does not go back into     urinary retention. 5. GI.  The patient did have problem with constipation.  She was given     MiraLax without results and then was started on milk of magnesia.     In the last 24 hours, she has had several loose and formed stools.     We would have her continue on MiraLax as an outpatient on once a     day therapy.  With the patient's multiple medical problems being stabilized at this point, she is ready for transfer to skilled care facility.  She may participate in all facility protocol.  She will continue  medications as outlined in the discharge note.  She will see her cardiologist in the near future.  We will request an appointment for her and the patient will also see Dr. Debby Bud on an as needed basis.  Her care at facility will be provided by facility physician.  The patient's code status is DNR and our facility order will accompany the patient at discharge.  The patient's condition at time of discharge dictation is guarded given her advanced age and multiple medical problems.     Rosalyn Gess Dawanna Grauberger, MD     MEN/MEDQ  D:  10/12/2013  T:  10/12/2013  Job:  [id number]

## 2013-10-12 NOTE — Progress Notes (Signed)
EMS arrived to transport pt to [facility].  BP 85/51.  Told to call to see if pt can still be accepted with current VS.  Spoke to [facility] and was informed that it is unacceptable for [facility].  Informed Dr. Debby Bud.  Agreed to keep pt overnight and will discharge in AM.

## 2013-10-13 ENCOUNTER — Encounter (HOSPITAL_COMMUNITY): Payer: Self-pay | Admitting: Emergency Medicine

## 2013-10-13 ENCOUNTER — Telehealth: Payer: Self-pay | Admitting: Cardiovascular Disease

## 2013-10-13 ENCOUNTER — Inpatient Hospital Stay (HOSPITAL_COMMUNITY)
Admission: EM | Admit: 2013-10-13 | Discharge: 2013-10-16 | DRG: 292 | Disposition: A | Discharge status: Hospice | Payer: Medicare Other | Attending: Internal Medicine | Admitting: Internal Medicine

## 2013-10-13 ENCOUNTER — Emergency Department (HOSPITAL_COMMUNITY): Payer: Medicare Other

## 2013-10-13 DIAGNOSIS — I509 Heart failure, unspecified: Secondary | ICD-10-CM | POA: Diagnosis present

## 2013-10-13 DIAGNOSIS — R413 Other amnesia: Secondary | ICD-10-CM

## 2013-10-13 DIAGNOSIS — I10 Essential (primary) hypertension: Secondary | ICD-10-CM

## 2013-10-13 DIAGNOSIS — E785 Hyperlipidemia, unspecified: Secondary | ICD-10-CM | POA: Diagnosis present

## 2013-10-13 DIAGNOSIS — H409 Unspecified glaucoma: Secondary | ICD-10-CM | POA: Diagnosis present

## 2013-10-13 DIAGNOSIS — E875 Hyperkalemia: Secondary | ICD-10-CM | POA: Diagnosis present

## 2013-10-13 DIAGNOSIS — Z7982 Long term (current) use of aspirin: Secondary | ICD-10-CM

## 2013-10-13 DIAGNOSIS — I959 Hypotension, unspecified: Secondary | ICD-10-CM | POA: Diagnosis present

## 2013-10-13 DIAGNOSIS — R011 Cardiac murmur, unspecified: Secondary | ICD-10-CM

## 2013-10-13 DIAGNOSIS — E871 Hypo-osmolality and hyponatremia: Secondary | ICD-10-CM | POA: Diagnosis present

## 2013-10-13 DIAGNOSIS — F0390 Unspecified dementia without behavioral disturbance: Secondary | ICD-10-CM | POA: Diagnosis present

## 2013-10-13 DIAGNOSIS — R627 Adult failure to thrive: Secondary | ICD-10-CM | POA: Diagnosis present

## 2013-10-13 DIAGNOSIS — Z79899 Other long term (current) drug therapy: Secondary | ICD-10-CM

## 2013-10-13 DIAGNOSIS — K219 Gastro-esophageal reflux disease without esophagitis: Secondary | ICD-10-CM | POA: Diagnosis present

## 2013-10-13 DIAGNOSIS — Z8249 Family history of ischemic heart disease and other diseases of the circulatory system: Secondary | ICD-10-CM

## 2013-10-13 DIAGNOSIS — I5033 Acute on chronic diastolic (congestive) heart failure: Principal | ICD-10-CM | POA: Diagnosis present

## 2013-10-13 DIAGNOSIS — K5900 Constipation, unspecified: Secondary | ICD-10-CM | POA: Diagnosis present

## 2013-10-13 DIAGNOSIS — M129 Arthropathy, unspecified: Secondary | ICD-10-CM | POA: Diagnosis present

## 2013-10-13 DIAGNOSIS — I2789 Other specified pulmonary heart diseases: Secondary | ICD-10-CM | POA: Diagnosis present

## 2013-10-13 DIAGNOSIS — Z87440 Personal history of urinary (tract) infections: Secondary | ICD-10-CM

## 2013-10-13 DIAGNOSIS — N289 Disorder of kidney and ureter, unspecified: Secondary | ICD-10-CM | POA: Diagnosis present

## 2013-10-13 DIAGNOSIS — I340 Nonrheumatic mitral (valve) insufficiency: Secondary | ICD-10-CM

## 2013-10-13 DIAGNOSIS — I080 Rheumatic disorders of both mitral and aortic valves: Secondary | ICD-10-CM | POA: Diagnosis present

## 2013-10-13 DIAGNOSIS — R600 Localized edema: Secondary | ICD-10-CM

## 2013-10-13 DIAGNOSIS — Z515 Encounter for palliative care: Secondary | ICD-10-CM

## 2013-10-13 DIAGNOSIS — Z993 Dependence on wheelchair: Secondary | ICD-10-CM

## 2013-10-13 DIAGNOSIS — Z66 Do not resuscitate: Secondary | ICD-10-CM | POA: Diagnosis present

## 2013-10-13 DIAGNOSIS — IMO0002 Reserved for concepts with insufficient information to code with codable children: Secondary | ICD-10-CM

## 2013-10-13 LAB — BASIC METABOLIC PANEL
Chloride: 86 mEq/L — ABNORMAL LOW (ref 96–112)
GFR calc Af Amer: 42 mL/min — ABNORMAL LOW (ref >90)
GFR calc non Af Amer: 36 mL/min — ABNORMAL LOW (ref >90)
Glucose, Bld: 122 mg/dL — ABNORMAL HIGH (ref 70–99)
Potassium: 4.8 mEq/L (ref 3.5–5.1)
Sodium: 124 mEq/L — ABNORMAL LOW (ref 135–145)

## 2013-10-13 LAB — CBC WITH DIFFERENTIAL/PLATELET
Basophils Absolute: 0 10*3/uL (ref 0.0–0.1)
Basophils Relative: 0 % (ref 0–1)
Eosinophils Absolute: 0.2 10*3/uL (ref 0.0–0.7)
Lymphocytes Relative: 20 % (ref 12–46)
Lymphs Abs: 1.9 10*3/uL (ref 0.7–4.0)
MCH: 29.9 pg (ref 26.0–34.0)
Neutro Abs: 6.7 10*3/uL (ref 1.7–7.7)
Neutrophils Relative %: 70 % (ref 43–77)
Platelets: 275 10*3/uL (ref 150–400)
RBC: 3.95 MIL/uL (ref 3.87–5.11)
WBC: 9.5 10*3/uL (ref 4.0–10.5)

## 2013-10-13 LAB — URINE MICROSCOPIC-ADD ON

## 2013-10-13 LAB — URINALYSIS, ROUTINE W REFLEX MICROSCOPIC
Glucose, UA: NEGATIVE mg/dL
Protein, ur: NEGATIVE mg/dL
Specific Gravity, Urine: 1.017 (ref 1.005–1.030)
Urobilinogen, UA: 0.2 mg/dL (ref 0.0–1.0)

## 2013-10-13 LAB — POCT I-STAT TROPONIN I

## 2013-10-13 MED ORDER — FUROSEMIDE 10 MG/ML IJ SOLN
40.0000 mg | Freq: Once | INTRAMUSCULAR | Status: AC
  Administered 2013-10-13: 40 mg via INTRAVENOUS
  Filled 2013-10-13: qty 4

## 2013-10-13 MED ORDER — FUROSEMIDE 40 MG PO TABS: 40.0000 mg | ORAL_TABLET | Freq: Every day | ORAL | Status: DC

## 2013-10-13 MED ORDER — ASPIRIN 81 MG PO CHEW
324.0000 mg | CHEWABLE_TABLET | Freq: Once | ORAL | Status: AC
  Administered 2013-10-13: 324 mg via ORAL
  Filled 2013-10-13: qty 4

## 2013-10-13 MED ORDER — FUROSEMIDE 40 MG PO TABS
40.0000 mg | ORAL_TABLET | Freq: Every day | ORAL | Status: DC
  Administered 2013-10-13: 40 mg via ORAL
  Filled 2013-10-13: qty 1

## 2013-10-13 NOTE — Progress Notes (Signed)
Subjective: Patient easily awakened. She is in no distress. Transfer to SNF was delayed due to a BP reading of 75/48. Subsequent readings, without any intervention: 99/57, 86/58, 94/68.  Objective: Lab: No results found for this basename: WBC, NEUTROABS, HGB, HCT, MCV, PLT,  in the last 72 hours  Recent Labs  10/11/13 0639  NA 125*  K 4.5  CL 88*  GLUCOSE 101*  BUN 37*  CREATININE 1.24*  CALCIUM 8.3*  PHOS 3.4    Imaging:  Scheduled Meds: . aspirin EC  81 mg Oral Daily  . docusate sodium  100 mg Oral QHS  . enoxaparin (LOVENOX) injection  30 mg Subcutaneous Q24H  . feeding supplement (ENSURE COMPLETE)  237 mL Oral TID BM  . furosemide  40 mg Intravenous Q12H  . loratadine  10 mg Oral Daily  . magnesium hydroxide  15 mL Oral Q4H  . multivitamin with minerals  1 tablet Oral Daily  . pantoprazole  80 mg Oral Daily  . propranolol  20 mg Oral BID  . sodium chloride  3 mL Intravenous Q12H  . tamsulosin  0.4 mg Oral Daily   Continuous Infusions:  PRN Meds:.acetaminophen, acetaminophen, albuterol, bisacodyl, ondansetron (ZOFRAN) IV, ondansetron   Physical Exam: Filed Vitals:   10/13/13 0625  BP: 94/68  Pulse: 67  Temp: 97.5 F (36.4 C)  Resp: 18   Gen'l Elderly white woman in no distress Cor - 2+ pulse Pulm - no increased WOB Neuro - at baseline     Assessment/Plan: Patient is stable for transfet to SNF   [employer] [facility] (o) [phone]; (c) [phone] Call-grp - [name]  Tele: [phone]  10/13/2013, 8:21 AM

## 2013-10-13 NOTE — Progress Notes (Signed)
Diane [name] to be D/C'd to [facility] per MD order.  Discussed with the patient and all questions fully answered.    Medication List         acetaminophen 325 MG tablet  Commonly known as:  TYLENOL  Take 2 tablets (650 mg total) by mouth every 6 (six) hours as needed for mild pain (or Fever >/= 101).     aspirin EC 81 MG tablet  Take 81 mg by mouth daily.     CLARITIN 10 MG Caps  Generic drug:  Loratadine  Take 10 mg by mouth daily.     Cranberry 1000 MG Caps  Take 1,000 mg by mouth daily.     docusate sodium 100 MG capsule  Commonly known as:  COLACE  Take 100 mg by mouth at bedtime.     feeding supplement (ENSURE COMPLETE) Liqd  Take 237 mLs by mouth 3 (three) times daily between meals.     furosemide 40 MG tablet  Commonly known as:  LASIX  Take 1 tablet (40 mg total) by mouth daily.     furosemide 40 MG tablet  Commonly known as:  LASIX  Take 40 mg by mouth every other day.     multivitamin with minerals Tabs tablet  Take 1 tablet by mouth daily.     omeprazole 40 MG capsule  Commonly known as:  PRILOSEC  Take 40 mg by mouth daily.     potassium chloride SA 20 MEQ tablet  Commonly known as:  K-DUR,KLOR-CON  Take 20 mEq by mouth every other day.     PROBIOTIC & ACIDOPHILUS EX ST PO  Take 1 tablet by mouth daily.     propranolol 20 MG tablet  Commonly known as:  INDERAL  Take 20 mg by mouth 2 (two) times daily.     tamsulosin 0.4 MG Caps capsule  Commonly known as:  FLOMAX  Take 1 capsule (0.4 mg total) by mouth daily.        VVS, Skin clean, dry and intact without evidence of skin break down, no evidence of skin tears noted. IV catheter discontinued intact. Site without signs and symptoms of complications. Dressing and pressure applied.  D/c education completed with patient/family including follow up instructions, medication list, d/c activities limitations if indicated, with other d/c instructions as indicated by MD - patient able  to verbalize understanding, all questions fully answered.    Patient escorted via EMS  Aldean Ast 10/13/2013 5:33 PM

## 2013-10-13 NOTE — Progress Notes (Signed)
NUTRITION FOLLOW UP  Intervention:   - d/c Ensure Complete - Add Magic cup TID between meals, each supplement provides 290 kcal and 9 grams of protein.   Nutrition Dx:   Inadequate oral intake related to dementia as evidenced by variable appetite and H&P; ongoing  Goal:   Pt to meet >/= 90% of their estimated nutrition needs; improving  Monitor:   Wt trends, labs, I/O's, PO intake, acceptance of supplements  Assessment:   PMHx significant for dementia, GERD, HLD, recurrent cystitis, CHF, discharge from hospice a couple of years ago. Admitted with nausea, fevers/chills and abdominal pain. Work-up reveals UTI.  Pt's family pursuing hospice care and discharge to SNF. Pt's daughter asked RD to stop having Ensure Complete sent to pt. She said that pt will try Magic Cup. Per MD note, pt continues diagnosis of failure to thrive. RD will continue to monitor. Per RD, pt's family does not want to pursue enteral nutrition at this time.  Height: Ht Readings from Last 1 Encounters:  09/27/13 5\' 2"  (1.575 m)    Weight Status:   Wt Readings from Last 1 Encounters:  10/13/13 144 lb 6.4 oz (65.5 kg)    Re-estimated needs:  Kcal: 1650-1950 Protein: 80-90 g Fluid: 1.7-2.0 L  Skin: WNL  Diet Order: Cardiac   Intake/Output Summary (Last 24 hours) at 10/13/13 1442 Last data filed at 10/13/13 0900  Gross per 24 hour  Intake    240 ml  Output     75 ml  Net    165 ml    Last BM: 12/4   Labs:   Recent Labs Lab 10/09/13 0855 10/10/13 0450 10/11/13 0639  NA 126* 127* 125*  K 4.1 4.0 4.5  CL 88* 90* 88*  CO2 25 25 23   BUN 31* 32* 37*  CREATININE 1.06 1.05 1.24*  CALCIUM 8.2* 8.4 8.3*  PHOS 3.3 3.5 3.4  GLUCOSE 116* 107* 101*    CBG (last 3)  No results found for this basename: GLUCAP,  in the last 72 hours  Scheduled Meds: . aspirin EC  81 mg Oral Daily  . docusate sodium  100 mg Oral QHS  . enoxaparin (LOVENOX) injection  30 mg Subcutaneous Q24H  . feeding supplement  (ENSURE COMPLETE)  237 mL Oral TID BM  . furosemide  40 mg Oral Daily  . loratadine  10 mg Oral Daily  . magnesium hydroxide  15 mL Oral Q4H  . multivitamin with minerals  1 tablet Oral Daily  . pantoprazole  80 mg Oral Daily  . propranolol  20 mg Oral BID  . sodium chloride  3 mL Intravenous Q12H  . tamsulosin  0.4 mg Oral Daily    Continuous Infusions:   Ebbie Latus RD, LDN

## 2013-10-13 NOTE — Telephone Encounter (Signed)
Please call asap-Would like Dr Allyson Sabal to refer her to Hospice.Pt is in [facility] hospital now,said tat Dr Allyson Sabal is the one that need to refer her to Hospice.

## 2013-10-13 NOTE — Telephone Encounter (Signed)
Daughter has requested a cardiac consult.  Dr. Debby Bud has her in the hospital for dehydration, UTI and wants her sent to Rehab not willing to call in hospice.  Told her to have them order the consult through the hospital and our providers will assess her.  Voiced understanding.

## 2013-10-13 NOTE — Clinical Social Work Placement (Signed)
Clinical Social Work Department CLINICAL SOCIAL WORK PLACEMENT NOTE 10/13/2013  Patient:  Diane [name], Diane [name]  Account Number:  [id number] Admit date:  09/27/2013  Clinical Social Worker:  Cherre Blanc, [place]  Date/time:  09/28/2013 01:00 PM  Clinical Social Work is seeking post-discharge placement for this patient at the following level of care:   SKILLED NURSING   (*CSW will update this form in Epic as items are completed)   09/28/2013  Patient/family provided with [facility] Department of Clinical Social Work's list of facilities offering this level of care within the geographic area requested by the patient (or if unable, by the patient's family).  09/28/2013  Patient/family informed of their freedom to choose among providers that offer the needed level of care, that participate in Medicare, Medicaid or managed care program needed by the patient, have an available bed and are willing to accept the patient.  09/28/2013  Patient/family informed of [facility]' ownership interest in [facility], as well as of the fact that they are under no obligation to receive care at this facility.  PASARR submitted to EDS on  PASARR number received from EDS on   FL2 transmitted to all facilities in geographic area requested by pt/family on  09/28/2013 FL2 transmitted to all facilities within larger geographic area on   Patient informed that his/her managed care company has contracts with or will negotiate with  certain facilities, including the following:     Patient/family informed of bed offers received:  09/29/2013 Patient chooses bed at [facility] Physician recommends and patient chooses bed at    Patient to be transferred to [facility] on  10/13/2013 Patient to be transferred to facility by Ambulance  The following physician request were entered in Epic:   Additional Comments: Per MD patient ready for DC to [facility] 10/13/13. RN, children  of patient, and facility notified of DC. RN given number for report. Ambulance transport requested for patient. DC packet left with patient's chart. CSW signing off.    Roddie Mc, [place], [place], [phone]

## 2013-10-13 NOTE — Progress Notes (Signed)
PT Cancellation Note  Patient Details Name: Diane [name] MRN: [id number] DOB: 01-May-1924   Cancelled Treatment:    Reason Eval/Treat Not Completed: Other (comment). Pt sitting up in the chair and c/o nausea. Spoke with RN. RN reports pt is set to D/C today to SNF. Will re-attempt to see pt at a later time per POC if she continues to stay in acute setting. Per new notes, possible consultation by hospice.    Donnamarie Poag [place], [place] [phone] 10/13/2013, 2:44 PM

## 2013-10-13 NOTE — Progress Notes (Signed)
1530: In to see family. Family verbalized concern in reference for patient to go to [facility]. Spoke with Bonita Quin, family member, who requested hospice consult to [facility]. She was concerned when the consult would occur, if patient would be kept comfortable and checked on frequently. Asked Department Director to speak with family.  1545: Department Director in to see family. Reviewed concerns and plan of care. Family reported wanting patient's goals of care to be comfort and concerns about going to [facility].   1550: [facility] called by Child psychotherapist. Concerns discussed. Patient's family satisfied with answers and gave permission for patient to be transported to [facility].

## 2013-10-13 NOTE — Progress Notes (Signed)
Report given to April at [facility]

## 2013-10-13 NOTE — ED Provider Notes (Signed)
TIME SEEN: 10:00 PM  CHIEF COMPLAINT: Chest pain, shortness of breath  HPI: Patient is an 79 year old female with a history of dementia, hyperlipidemia, CHF, recent admission for urinary tract infection who presents the emergency department from her nursing facility [facility] with chest pain shortness of breath. Patient was recently discharged from the hospital today to a nursing facility. Prior to her recent admission, patient was living at home alone. Family reports that when they went to visit the patient she was grabbing at her chest and complaining of feeling very short of breath and gasping for air. Family describes that she has had JVD and peripheral edema. Patient reports that her shortness of breath is worse with lying flat. She denies any fevers or cough. No vomiting or diarrhea. No chest pain currently.  ROS: See HPI, limited secondary to patient's dementia Constitutional: no fever  Eyes: no drainage  ENT: no runny nose   Cardiovascular:  chest pain  Resp: SOB  GI: no vomiting GU: no dysuria Integumentary: no rash  Allergy: no hives  Musculoskeletal: leg swelling  Neurological: no slurred speech ROS otherwise negative  PAST MEDICAL HISTORY/PAST SURGICAL HISTORY:  Past Medical History  Diagnosis Date  . Arthritis     worst:hands and shoulder  . GERD (gastroesophageal reflux disease)   . Glaucoma     s/p laser surgery  . Hyperlipidemia   . Heart murmur     MVR, AVS  . Migraine     resolved with menopause  . CHF (congestive heart failure), NYHA class IV     had been a hospice patient  . Cystitis     h/o frequent bouts  . Measles     childhood  . Varicella without complication     chilodhood  . Pneumonia 2004  . UTI (urinary tract infection) 09/26/2013  . Dehydration 09/2013    MEDICATIONS:  Prior to Admission medications   Medication Sig Start Date End Date Taking? Authorizing Provider  acetaminophen (TYLENOL) 325 MG tablet Take 2 tablets (650 mg  total) by mouth every 6 (six) hours as needed for mild pain (or Fever >/= 101). 10/12/13  Yes Jacques Navy, MD  aspirin EC 81 MG tablet Take 81 mg by mouth daily.     Yes Historical Provider, MD  Cranberry 1000 MG CAPS Take 1,000 mg by mouth daily.    Yes Historical Provider, MD  docusate sodium (COLACE) 100 MG capsule Take 100 mg by mouth at bedtime.   Yes Historical Provider, MD  feeding supplement, ENSURE COMPLETE, (ENSURE COMPLETE) LIQD Take 237 mLs by mouth 3 (three) times daily between meals. 10/12/13  Yes Jacques Navy, MD  furosemide (LASIX) 40 MG tablet Take 1 tablet (40 mg total) by mouth daily. 10/13/13  Yes Jacques Navy, MD  Loratadine (CLARITIN) 10 MG CAPS Take 10 mg by mouth daily.    Yes Historical Provider, MD  Multiple Vitamin (MULTIVITAMIN WITH MINERALS) TABS tablet Take 1 tablet by mouth daily.   Yes Historical Provider, MD  omeprazole (PRILOSEC) 40 MG capsule Take 40 mg by mouth daily.   Yes Historical Provider, MD  potassium chloride SA (K-DUR,KLOR-CON) 20 MEQ tablet Take 20 mEq by mouth every other day.   Yes Historical Provider, MD  Probiotic Product (PROBIOTIC & ACIDOPHILUS EX ST PO) Take 1 tablet by mouth daily.    Yes Historical Provider, MD  propranolol (INDERAL) 20 MG tablet Take 20 mg by mouth 2 (two) times daily.   Yes Historical Provider,  MD  tamsulosin (FLOMAX) 0.4 MG CAPS capsule Take 1 capsule (0.4 mg total) by mouth daily. 10/12/13  Yes Jacques Navy, MD    ALLERGIES:  Allergies  Allergen Reactions  . Alendronate Sodium   . Biapenem   . Evista [Raloxifene Hydrochloride]   . Lansoprazole   . Levofloxacin   . Lipitor [Atorvastatin Calcium]   . Lyrica [Pregabalin]   . Miacalcin   . Prednisone   . Pyridium [Phenazopyridine Hcl]   . Statins Other (See Comments)    Cardiac reaction  . Sulfa Drugs Cross Reactors Nausea Only  . Welchol [Colesevelam Hcl]   . Zetia [Ezetimibe]     Cardiac reaction  . Penicillins Rash    SOCIAL HISTORY:  History   Substance Use Topics  . Smoking status: Never Smoker   . Smokeless tobacco: Never Used  . Alcohol Use: No    FAMILY HISTORY: Family History  Problem Relation Age of Onset  . Alcohol abuse Father     EXAM: BP 107/66  Pulse 93  Temp(Src) 98.5 F (36.9 C) (Oral)  Resp 19  Ht 5\' 2"  (1.575 m)  Wt 130 lb (58.968 kg)  BMI 23.77 kg/m2  SpO2 99% CONSTITUTIONAL: Alert and oriented and responds appropriately to questions. Well-appearing; well-nourished HEAD: Normocephalic EYES: Conjunctivae clear, PERRL ENT: normal nose; no rhinorrhea; moist mucous membranes; pharynx without lesions noted NECK: Supple, no meningismus, no LAD  CARD: RRR; S1 and S2 appreciated; no murmurs, no clicks, no rubs, no gallops RESP: Normal chest excursion without splinting or tachypnea; breath sounds clear and equal bilaterally; no wheezes, no rhonchi, mild rales at her bases bilaterally ABD/GI: Normal bowel sounds; non-distended; soft, non-tender, no rebound, no guarding BACK:  The back appears normal and is non-tender to palpation, there is no CVA tenderness EXT: Normal ROM in all joints; non-tender to palpation; bilateral peripheral edema in her lower extremities to the midcalf; normal capillary refill; no cyanosis    SKIN: Normal color for age and race; warm NEURO: Moves all extremities equally PSYCH: The patient's mood and manner are appropriate. Grooming and personal hygiene are appropriate.  MEDICAL DECISION MAKING: Patient here with signs of volume overload. She was recently discharged from the hospital to nursing facility today. Her EKG shows worsening T wave inversions in anterior leads compared to prior EKG November 2014. Will give aspirin and Lasix. Family reports her blood pressure is normally in the 90s/60s where she is at currently. She is having mild hypoxia in the emergency department and is on oxygen currently. We'll obtain cardiac workup, chest x-ray. Anticipate admission per family request. She  is DO NOT RESUSCITATE but they are interested and further evaluation and treatment for volume overload.  ED PROGRESS: Patient's chest x-ray shows vascular congestion and pulmonary edema. Her BNP is greater than 20,000. Will diurese. Her primary care physician is Dr. Debby Bud. Discussed with hospitalist for admission.   EKG Interpretation    Date/Time:  Friday October 13 2013 21:24:26 EST Ventricular Rate:  93 PR Interval:  199 QRS Duration: 103 QT Interval:  363 QTC Calculation: 451 R Axis:   93 Text Interpretation:  Sinus rhythm Right axis deviation T wave inversion more evident in Anterior leads Confirmed by Brielynn Sekula  DO, Eufelia Veno ([id number]) on 10/13/2013 10:57:53 PM             Layla Maw Antanisha Mohs, DO 10/13/13 2324

## 2013-10-13 NOTE — Clinical Social Work Note (Signed)
3:00 PM: CSW visited daughter Lupita Leash at bedside to discuss discharge to [facility]. Daughter stated that she wanted the patient to receive hospice services. CSW explained to daughter that discharge order is in and that patient can receive hospice services at [facility]. Daughter satisfied with discharge.  3:30 PM: CSW revisited daughter and son at bedside as daughter in law voiced concerns about patient discharging to [facility]. Daughter in-law voiced her frustration and stated that she did not want the patient to discharge to [facility] and that she wanted the patient to be placed under hospice care while in the hospital. CSW explained that MD has discharged patient and that hospice services will be ordered by MD at [facility]. Daughter in-law was still dissatisfied by this. CSW and RN requested that unit director speak with family at bedside.  4:00 PM: Per Water quality scientist, family wants CSW to confirm that patient will be checked on frequently at [facility], the patient will receive hospice services in a timely manner, and that the patient will receive comfort care over the weekend if she experiences any pain. CSW contacted nurse liaison with [facility] to share family's concerns. Nurse liaison stated that she would speak with [facility] director to determine if the family's wishes could be met. CSW waiting for liaison's call.  4:20 PM: CSW met with family at bedside with nurse liaison on speaker phone so that family's concerns could be discussed in an open manner. Nurse liaison explained what [facility] could do for patient and answered family's questions to their satisfaction. Daughter in-law was still reluctant to discharge patient and stated "well, I've been outnumbered." The patient's son and daughter both stated that they were ready to move forward with discharging the patient to [facility] with hospice services.   4:30 PM: RN informed that children of patient are ready to DC patient to [facility]. Ambulance transport requested for patient. DC packet left with patient's chart.  CSW signing off.  Roddie Mc, [place], [place], [phone]

## 2013-10-13 NOTE — ED Notes (Signed)
Patient presents to ED via [facility]. Patient family called EMS stating that patient was experiencing chest pain- pt is from [facility]. Pt was just discharged from hospital approx 2 hours ago per family. Pt was admitted for a UTI. Pt not c/o of chest pain at this time- is c/o shortness of breath. Denies any other symptoms. A&Ox3. VSS. No acute distress noted at this time. Family at bedside.

## 2013-10-13 NOTE — ED Notes (Signed)
Patient transported to X-ray 

## 2013-10-14 ENCOUNTER — Other Ambulatory Visit: Payer: Self-pay

## 2013-10-14 ENCOUNTER — Encounter (HOSPITAL_COMMUNITY): Payer: Self-pay | Admitting: Internal Medicine

## 2013-10-14 DIAGNOSIS — I10 Essential (primary) hypertension: Secondary | ICD-10-CM

## 2013-10-14 DIAGNOSIS — I059 Rheumatic mitral valve disease, unspecified: Secondary | ICD-10-CM

## 2013-10-14 DIAGNOSIS — E871 Hypo-osmolality and hyponatremia: Secondary | ICD-10-CM | POA: Diagnosis present

## 2013-10-14 DIAGNOSIS — R609 Edema, unspecified: Secondary | ICD-10-CM

## 2013-10-14 DIAGNOSIS — Z66 Do not resuscitate: Secondary | ICD-10-CM

## 2013-10-14 DIAGNOSIS — I509 Heart failure, unspecified: Secondary | ICD-10-CM | POA: Diagnosis present

## 2013-10-14 DIAGNOSIS — R011 Cardiac murmur, unspecified: Secondary | ICD-10-CM

## 2013-10-14 DIAGNOSIS — I959 Hypotension, unspecified: Secondary | ICD-10-CM | POA: Diagnosis present

## 2013-10-14 LAB — CREATININE, SERUM
GFR calc Af Amer: 43 mL/min — ABNORMAL LOW (ref >90)
GFR calc non Af Amer: 37 mL/min — ABNORMAL LOW (ref >90)

## 2013-10-14 LAB — CBC WITH DIFFERENTIAL/PLATELET
Basophils Absolute: 0 10*3/uL (ref 0.0–0.1)
Basophils Relative: 0 % (ref 0–1)
Eosinophils Absolute: 0.2 10*3/uL (ref 0.0–0.7)
Eosinophils Relative: 2 % (ref 0–5)
HCT: 35.8 % — ABNORMAL LOW (ref 36.0–46.0)
Hemoglobin: 11.9 g/dL — ABNORMAL LOW (ref 12.0–15.0)
MCH: 30.1 pg (ref 26.0–34.0)
MCHC: 33.2 g/dL (ref 30.0–36.0)
MCV: 90.4 fL (ref 78.0–100.0)
Monocytes Absolute: 0.6 10*3/uL (ref 0.1–1.0)
Monocytes Relative: 7 % (ref 3–12)
RBC: 3.96 MIL/uL (ref 3.87–5.11)
RDW: 16 % — ABNORMAL HIGH (ref 11.5–15.5)

## 2013-10-14 LAB — MRSA PCR SCREENING: MRSA by PCR: NEGATIVE

## 2013-10-14 LAB — TROPONIN I
Troponin I: <0.3 ng/mL (ref <0.30)
Troponin I: <0.3 ng/mL (ref <0.30)

## 2013-10-14 MED ORDER — SODIUM CHLORIDE 0.9 % IV SOLN: 250.0000 mL | INTRAVENOUS | Status: DC | PRN

## 2013-10-14 MED ORDER — SODIUM CHLORIDE 0.9 % IJ SOLN: 3.0000 mL | INTRAMUSCULAR | Status: DC | PRN

## 2013-10-14 MED ORDER — ACETAMINOPHEN 325 MG PO TABS: 650.0000 mg | ORAL_TABLET | Freq: Four times a day (QID) | ORAL | Status: DC | PRN

## 2013-10-14 MED ORDER — LORAZEPAM 2 MG/ML IJ SOLN
0.5000 mg | Freq: Four times a day (QID) | INTRAMUSCULAR | Status: DC | PRN
  Administered 2013-10-15 – 2013-10-16 (×2): 0.5 mg via INTRAVENOUS
  Filled 2013-10-14 (×2): qty 1

## 2013-10-14 MED ORDER — FUROSEMIDE 10 MG/ML IJ SOLN
40.0000 mg | Freq: Every day | INTRAMUSCULAR | Status: DC
  Filled 2013-10-14: qty 4

## 2013-10-14 MED ORDER — ONDANSETRON HCL 4 MG/2ML IJ SOLN: 4.0000 mg | Freq: Four times a day (QID) | INTRAMUSCULAR | Status: DC | PRN

## 2013-10-14 MED ORDER — ENOXAPARIN SODIUM 30 MG/0.3ML ~~LOC~~ SOLN
30.0000 mg | SUBCUTANEOUS | Status: DC
  Administered 2013-10-14: 30 mg via SUBCUTANEOUS
  Filled 2013-10-14 (×2): qty 0.3

## 2013-10-14 MED ORDER — POLYETHYLENE GLYCOL 3350 17 G PO PACK
17.0000 g | PACK | Freq: Every day | ORAL | Status: DC
  Administered 2013-10-14 – 2013-10-16 (×3): 17 g via ORAL
  Filled 2013-10-14 (×3): qty 1

## 2013-10-14 MED ORDER — CARVEDILOL 3.125 MG PO TABS
3.1250 mg | ORAL_TABLET | Freq: Two times a day (BID) | ORAL | Status: DC
  Administered 2013-10-14 – 2013-10-15 (×2): 3.125 mg via ORAL
  Filled 2013-10-14 (×4): qty 1

## 2013-10-14 MED ORDER — FUROSEMIDE 10 MG/ML IJ SOLN
40.0000 mg | Freq: Two times a day (BID) | INTRAMUSCULAR | Status: DC
  Administered 2013-10-14 (×2): 40 mg via INTRAVENOUS
  Filled 2013-10-14 (×2): qty 4

## 2013-10-14 MED ORDER — PROPRANOLOL HCL 20 MG PO TABS
20.0000 mg | ORAL_TABLET | Freq: Two times a day (BID) | ORAL | Status: DC
  Administered 2013-10-14: 20 mg via ORAL
  Filled 2013-10-14 (×2): qty 1

## 2013-10-14 MED ORDER — ACETAMINOPHEN 325 MG PO TABS: 650.0000 mg | ORAL_TABLET | ORAL | Status: DC | PRN

## 2013-10-14 MED ORDER — SODIUM CHLORIDE 0.9 % IJ SOLN
3.0000 mL | Freq: Two times a day (BID) | INTRAMUSCULAR | Status: DC
  Administered 2013-10-14 – 2013-10-16 (×5): 3 mL via INTRAVENOUS

## 2013-10-14 MED ORDER — TAMSULOSIN HCL 0.4 MG PO CAPS
0.4000 mg | ORAL_CAPSULE | Freq: Every day | ORAL | Status: DC
  Administered 2013-10-14 – 2013-10-16 (×3): 0.4 mg via ORAL
  Filled 2013-10-14 (×3): qty 1

## 2013-10-14 MED ORDER — SPIRONOLACTONE 12.5 MG HALF TABLET
12.5000 mg | ORAL_TABLET | Freq: Every day | ORAL | Status: DC
  Administered 2013-10-14: 13:00:00 12.5 mg via ORAL
  Filled 2013-10-14 (×2): qty 1

## 2013-10-14 MED ORDER — ASPIRIN EC 81 MG PO TBEC
81.0000 mg | DELAYED_RELEASE_TABLET | Freq: Every day | ORAL | Status: DC
  Administered 2013-10-14: 81 mg via ORAL
  Filled 2013-10-14 (×2): qty 1

## 2013-10-14 MED ORDER — PANTOPRAZOLE SODIUM 40 MG PO TBEC
40.0000 mg | DELAYED_RELEASE_TABLET | Freq: Every day | ORAL | Status: DC
  Administered 2013-10-14 – 2013-10-16 (×3): 40 mg via ORAL
  Filled 2013-10-14 (×2): qty 1

## 2013-10-14 MED ORDER — LORATADINE 10 MG PO TABS
10.0000 mg | ORAL_TABLET | Freq: Every day | ORAL | Status: DC
  Administered 2013-10-14 – 2013-10-16 (×3): 10 mg via ORAL
  Filled 2013-10-14 (×3): qty 1

## 2013-10-14 MED ORDER — DOCUSATE SODIUM 100 MG PO CAPS
100.0000 mg | ORAL_CAPSULE | Freq: Every day | ORAL | Status: DC
  Administered 2013-10-14 – 2013-10-15 (×2): 100 mg via ORAL
  Filled 2013-10-14 (×3): qty 1

## 2013-10-14 NOTE — Progress Notes (Signed)
Subjective: Patient readmitted same day as discharge for c/o SOB in setting of known CHF.  Diane [name] is sitting in a geri-chair in no distress. She said she had a rough night - all over [place]. She is in no distress.  Objective: Lab:  Recent Labs  10/13/13 2205 10/14/13 0503  WBC 9.5 7.8  NEUTROABS 6.7 5.6  HGB 11.8* 11.9*  HCT 35.5* 35.8*  MCV 89.9 90.4  PLT 275 260    Recent Labs  10/13/13 2205 10/14/13 0503  NA 124*  --   K 4.8  --   CL 86*  --   GLUCOSE 122*  --   BUN 42*  --   CREATININE 1.28* 1.27*  CALCIUM 8.2*  --   MG  --  2.7*   Cardiac Panel (last 3 results)  Recent Labs  10/14/13 0503  TROPONINI <0.30   BNP 20260 (25517 @ d/c)   Imaging: 10/10/13 CXR  IMPRESSION:  Cardiomegaly, pulmonary vascular congestion with probable mild  interstitial pulmonary edema.  Small bilateral pleural effusions with bibasilar opacities - favor  atelectasis over airspace disease/pneumonia.   10/13/13 CXR  IMPRESSION:  Vascular congestion and cardiomegaly, with bilateral central  airspace opacities and small bilateral pleural effusions, compatible  with mildly worsened pulmonary edema.  Scheduled Meds: . aspirin EC  81 mg Oral Daily  . docusate sodium  100 mg Oral QHS  . enoxaparin (LOVENOX) injection  30 mg Subcutaneous Q24H  . furosemide  40 mg Intravenous Daily  . loratadine  10 mg Oral Daily  . pantoprazole  40 mg Oral Daily  . propranolol  20 mg Oral BID  . sodium chloride  3 mL Intravenous Q12H   Continuous Infusions:  PRN Meds:.sodium chloride, acetaminophen, LORazepam, ondansetron (ZOFRAN) IV, sodium chloride   Physical Exam: Filed Vitals:   10/14/13 0925  BP: 95/57  Pulse: 90  Temp: 98 F (36.7 C)  Resp: 18    Intake/Output Summary (Last 24 hours) at 10/14/13 0947 Last data filed at 10/14/13 0900  Gross per 24 hour  Intake    240 ml  Output    200 ml  Net     40 ml   Gen'l - frail elderly white woman in no distress. HOH HEENT-  normal Cor - 2+ radial pulse, RRR, III/VI systolic mm best at apex, II/VI mm RSB Pulm - decreased breath sounds, bibasilar rales, no wheezing, no increased WOB Abd- soft Neuro - HOH, awake and alert - recognizes examiner.       Assessment/Plan: 1. Cardiac - patient with flair mitral valve, aortic valvular disease, CAD, grade 2 diastolic dysfunction by 2 D echo 11/21/14L: Study Conclusions  - Left ventricle: The cavity size was normal. Wall thickness was normal. Systolic function was normal. The estimated ejection fraction was in the range of 60% to 65%. Wall motion was normal; there were no regional wall motion abnormalities. Features are consistent with a pseudonormal left ventricular filling pattern, with concomitant abnormal relaxation and increased filling pressure (grade 2 diastolic dysfunction). Doppler parameters are consistent with high ventricular filling pressure. - Aortic valve: Moderate regurgitation. - Mitral valve: Calcified annulus. Severe regurgitation. - Left atrium: The atrium was moderately dilated. - Right ventricle: The cavity size was mildly dilated. Systolic pressure was increased. - Right atrium: The atrium was mildly dilated. - Tricuspid valve: Moderate regurgitation. - Pulmonary arteries: PA peak pressure: 75mm Hg (S). Impressions:  - Flail posterior leaflet with prolapse of posterior leaflet into left atrial. Severe mitral regurgitation  directed toward the atrial septum. Suggest TEE to define mitral valve anatomy further  During her hospital stay she was diuresed with both PO and IV lasix, limited by hypotension. Despite IV lasix her output was low. She had several episodes, usually in PM, of dyspnea and distress but her O2 sat would remain acceptable. Once reoriented and calmed her symptoms would abate. At d/c she had stable CHF with an elevated BNP 25,517 but good sats and mild CHF by CXR. She is not a candidate for cardiac interventional care.    Soon after arrival at [facility] 10/13/13 PM she c/o dyspnea and chest pain. She was subsequently returned to hospital and readmitted. Her CXR with minimal progressive CHF, BNP was down to 20,000 and Troponin was negative. She is now comfortable, w/ increased WOB but with rales on exam.   Plan IV lasix 40 mg q12  Careful attention to BP  Daily weights  Cardiology consult in accord with family wishes  2. Renal/FEN - patient was fully evaluated by renal during recent admission - thought to have chronic hyponatremia, multi-factorial 2/2 malnutrition, HF, meds, reset osmostat. She had been treated with NS but this is limited by HF. She has been on fluid restriction.  Plan Fluid restriction  3. GU  She had trouble with urinary retention during recent admission and did well with tamsulosin daily. Currently she had a foley catheter in place.  Plan Watch for rentention once foley is removed.  4. GI -  Constipation had been a problem. She had several BMs prior to discharge.  Plan Continue with daily miralax.  5. End of life care - patient had been a hospice patient 3 years ago. At discharge a referral had been called in to [facility] for consult for symptom management at SNF. In conversatin with Diane [name], dtr, the desire is to focyus on comfort care only if there is no way for meaningful recovery and return to independence.  Plan Family conference tomorrow at 10:00 AM to firm up goals of care.   Plan    [name] (o) [phone]; (c) [phone] Call-grp - [name]  Tele: [phone]  10/14/2013, 9:34 AM

## 2013-10-14 NOTE — ED Notes (Signed)
Admitting physician at bedside

## 2013-10-14 NOTE — Consult Note (Signed)
Reason for Consult: CHF Referring Physician: Dr. Debby Bud    HPI: The patient is a 79 y/o female, followed by Dr. Allyson Sabal, with a history of severe MR, moderate AI and pulmonary hypertension as well as hyperlipidemia, intolerant to statin drugs. She recently had a 2D echo on 09/29/13, which demonstrated normal systolic function, with an EF of 60-65% and Grade 2 diastolic dysfunction.  It also demonstrated a flail posterior leaflet with prolapse of posterior leaflet into the left atrium and severe mitral regurgitation, directed toward the atrial septum. Her last Myoview was in 2011 and was nonischemic.   She was recently admitted by Internal Medicine for AMS, secondary to a UTI and was treated with antibiotics. She was also noted to be in acute diastolic HF. She was diuresed with both PO and IV Lasix, however this was limited due to hypotension. Despite IV Lasix, she had low urine output. She was discharged yesterday to a SNF. Her last BNP, prior to discharge, was 25,517. This was last assessed 10/11/13. However, she was felt to be stable for discharge based on O2 sats and CXR. Shortly after arriving at the SNF, she developed dyspnea and chest pain. She returned to [facility] and was readmitted once again by Internal Medicine. On arrival, her BNP remained elevated but had improved to 20,000. Troponin was negative. She was given 40 mg IV Lasix this morning and reports slight improvement with breathing. She continues to feel "very tired". She denies CP.    Past Medical History  Diagnosis Date  . Arthritis     worst:hands and shoulder  . GERD (gastroesophageal reflux disease)   . Glaucoma     s/p laser surgery  . Hyperlipidemia   . Heart murmur     MVR, AVS  . Migraine     resolved with menopause  . CHF (congestive heart failure), NYHA class IV     had been a hospice patient  . Cystitis     h/o frequent bouts  . Measles     childhood  . Varicella without complication     chilodhood  . Pneumonia 2004    . UTI (urinary tract infection) 09/26/2013  . Dehydration 09/2013    Past Surgical History  Procedure Laterality Date  . Breast biopsy    . Appendectomy      '41  . Tonsillectomy and adenoidectomy  '47  . Cholecystectomy  1980's  . Abdominal hysterectomy      1969  . G4p2    . Shoulder arthroscopy      right    Family History  Problem Relation Age of Onset  . Alcohol abuse Father   . Heart disease Sister   . Heart disease Son     Social History:  reports that she has been passively smoking.  She has never used smokeless tobacco. She reports that she does not drink alcohol or use illicit drugs.  Allergies:  Allergies  Allergen Reactions  . Alendronate Sodium   . Biapenem   . Evista [Raloxifene Hydrochloride]   . Lansoprazole   . Levofloxacin   . Lipitor [Atorvastatin Calcium]   . Lyrica [Pregabalin]   . Miacalcin   . Prednisone     Altered mental status  . Pyridium [Phenazopyridine Hcl]   . Statins Other (See Comments)    Cardiac reaction  . Sulfa Drugs Cross Reactors Nausea Only  . Welchol [Colesevelam Hcl]   . Zetia [Ezetimibe]     Cardiac reaction  . Penicillins Rash  Medications:  Prior to Admission medications   Medication Sig Start Date End Date Taking? Authorizing Provider  acetaminophen (TYLENOL) 325 MG tablet Take 2 tablets (650 mg total) by mouth every 6 (six) hours as needed for mild pain (or Fever >/= 101). 10/12/13  Yes Jacques Navy, MD  aspirin EC 81 MG tablet Take 81 mg by mouth daily.     Yes Historical Provider, MD  Cranberry 1000 MG CAPS Take 1,000 mg by mouth daily.    Yes Historical Provider, MD  docusate sodium (COLACE) 100 MG capsule Take 100 mg by mouth at bedtime.   Yes Historical Provider, MD  feeding supplement, ENSURE COMPLETE, (ENSURE COMPLETE) LIQD Take 237 mLs by mouth 3 (three) times daily between meals. 10/12/13  Yes Jacques Navy, MD  furosemide (LASIX) 40 MG tablet Take 1 tablet (40 mg total) by mouth daily. 10/13/13   Yes Jacques Navy, MD  Loratadine (CLARITIN) 10 MG CAPS Take 10 mg by mouth daily.    Yes Historical Provider, MD  Multiple Vitamin (MULTIVITAMIN WITH MINERALS) TABS tablet Take 1 tablet by mouth daily.   Yes Historical Provider, MD  omeprazole (PRILOSEC) 40 MG capsule Take 40 mg by mouth daily.   Yes Historical Provider, MD  potassium chloride SA (K-DUR,KLOR-CON) 20 MEQ tablet Take 20 mEq by mouth every other day.   Yes Historical Provider, MD  Probiotic Product (PROBIOTIC & ACIDOPHILUS EX ST PO) Take 1 tablet by mouth daily.    Yes Historical Provider, MD  propranolol (INDERAL) 20 MG tablet Take 20 mg by mouth 2 (two) times daily.   Yes Historical Provider, MD  tamsulosin (FLOMAX) 0.4 MG CAPS capsule Take 1 capsule (0.4 mg total) by mouth daily. 10/12/13  Yes Jacques Navy, MD     Results for orders placed during the hospital encounter of 10/13/13 (from the past 48 hour(s))  CBC WITH DIFFERENTIAL     Status: Abnormal   Collection Time    10/13/13 10:05 PM      Result Value Range   WBC 9.5  4.0 - 10.5 K/uL   RBC 3.95  3.87 - 5.11 MIL/uL   Hemoglobin 11.8 (*) 12.0 - 15.0 g/dL   HCT 14.7 (*) 82.9 - 56.2 %   MCV 89.9  78.0 - 100.0 fL   MCH 29.9  26.0 - 34.0 pg   MCHC 33.2  30.0 - 36.0 g/dL   RDW 13.0 (*) 86.5 - 78.4 %   Platelets 275  150 - 400 K/uL   Neutrophils Relative % 70  43 - 77 %   Neutro Abs 6.7  1.7 - 7.7 K/uL   Lymphocytes Relative 20  12 - 46 %   Lymphs Abs 1.9  0.7 - 4.0 K/uL   Monocytes Relative 8  3 - 12 %   Monocytes Absolute 0.8  0.1 - 1.0 K/uL   Eosinophils Relative 2  0 - 5 %   Eosinophils Absolute 0.2  0.0 - 0.7 K/uL   Basophils Relative 0  0 - 1 %   Basophils Absolute 0.0  0.0 - 0.1 K/uL  BASIC METABOLIC PANEL     Status: Abnormal   Collection Time    10/13/13 10:05 PM      Result Value Range   Sodium 124 (*) 135 - 145 mEq/L   Potassium 4.8  3.5 - 5.1 mEq/L   Chloride 86 (*) 96 - 112 mEq/L   CO2 29  19 - 32 mEq/L   Glucose, Bld  122 (*) 70 - 99 mg/dL     BUN 42 (*) 6 - 23 mg/dL   Creatinine, Ser 4.09 (*) 0.50 - 1.10 mg/dL   Calcium 8.2 (*) 8.4 - 10.5 mg/dL   GFR calc non Af Amer 36 (*) >90 mL/min   GFR calc Af Amer 42 (*) >90 mL/min   Comment: (NOTE)     The eGFR has been calculated using the CKD EPI equation.     This calculation has not been validated in all clinical situations.     eGFR's persistently <90 mL/min signify possible Chronic Kidney     Disease.  PRO B NATRIURETIC PEPTIDE     Status: Abnormal   Collection Time    10/13/13 10:05 PM      Result Value Range   Pro B Natriuretic peptide (BNP) 20260.0 (*) 0 - 450 pg/mL  POCT I-STAT TROPONIN I     Status: None   Collection Time    10/13/13 10:08 PM      Result Value Range   Troponin i, poc 0.02  0.00 - 0.08 ng/mL   Comment 3            Comment: Due to the release kinetics of cTnI,     a negative result within the first hours     of the onset of symptoms does not rule out     myocardial infarction with certainty.     If myocardial infarction is still suspected,     repeat the test at appropriate intervals.  URINALYSIS, ROUTINE W REFLEX MICROSCOPIC     Status: Abnormal   Collection Time    10/13/13 10:54 PM      Result Value Range   Color, Urine YELLOW  YELLOW   APPearance CLEAR  CLEAR   Specific Gravity, Urine 1.017  1.005 - 1.030   pH 5.5  5.0 - 8.0   Glucose, UA NEGATIVE  NEGATIVE mg/dL   Hgb urine dipstick NEGATIVE  NEGATIVE   Bilirubin Urine NEGATIVE  NEGATIVE   Ketones, ur NEGATIVE  NEGATIVE mg/dL   Protein, ur NEGATIVE  NEGATIVE mg/dL   Urobilinogen, UA 0.2  0.0 - 1.0 mg/dL   Nitrite NEGATIVE  NEGATIVE   Leukocytes, UA SMALL (*) NEGATIVE  URINE MICROSCOPIC-ADD ON     Status: Abnormal   Collection Time    10/13/13 10:54 PM      Result Value Range   Squamous Epithelial / LPF FEW (*) RARE   WBC, UA 3-6  <3 WBC/hpf   RBC / HPF 0-2  <3 RBC/hpf   Bacteria, UA FEW (*) RARE   Casts HYALINE CASTS (*) NEGATIVE   Urine-Other RARE YEAST    MRSA PCR SCREENING      Status: None   Collection Time    10/14/13  4:27 AM      Result Value Range   MRSA by PCR NEGATIVE  NEGATIVE   Comment:            The GeneXpert MRSA Assay (FDA     approved for NASAL specimens     only), is one component of a     comprehensive MRSA colonization     surveillance program. It is not     intended to diagnose MRSA     infection nor to guide or     monitor treatment for     MRSA infections.  CBC WITH DIFFERENTIAL     Status: Abnormal   Collection Time    10/14/13  5:03 AM      Result Value Range   WBC 7.8  4.0 - 10.5 K/uL   RBC 3.96  3.87 - 5.11 MIL/uL   Hemoglobin 11.9 (*) 12.0 - 15.0 g/dL   HCT 16.1 (*) 09.6 - 04.5 %   MCV 90.4  78.0 - 100.0 fL   MCH 30.1  26.0 - 34.0 pg   MCHC 33.2  30.0 - 36.0 g/dL   RDW 40.9 (*) 81.1 - 91.4 %   Platelets 260  150 - 400 K/uL   Neutrophils Relative % 71  43 - 77 %   Neutro Abs 5.6  1.7 - 7.7 K/uL   Lymphocytes Relative 19  12 - 46 %   Lymphs Abs 1.5  0.7 - 4.0 K/uL   Monocytes Relative 7  3 - 12 %   Monocytes Absolute 0.6  0.1 - 1.0 K/uL   Eosinophils Relative 2  0 - 5 %   Eosinophils Absolute 0.2  0.0 - 0.7 K/uL   Basophils Relative 0  0 - 1 %   Basophils Absolute 0.0  0.0 - 0.1 K/uL  MAGNESIUM     Status: Abnormal   Collection Time    10/14/13  5:03 AM      Result Value Range   Magnesium 2.7 (*) 1.5 - 2.5 mg/dL  TROPONIN I     Status: None   Collection Time    10/14/13  5:03 AM      Result Value Range   Troponin I <0.30  <0.30 ng/mL   Comment:            Due to the release kinetics of cTnI,     a negative result within the first hours     of the onset of symptoms does not rule out     myocardial infarction with certainty.     If myocardial infarction is still suspected,     repeat the test at appropriate intervals.  CREATININE, SERUM     Status: Abnormal   Collection Time    10/14/13  5:03 AM      Result Value Range   Creatinine, Ser 1.27 (*) 0.50 - 1.10 mg/dL   GFR calc non Af Amer 37 (*) >90 mL/min    GFR calc Af Amer 43 (*) >90 mL/min   Comment: (NOTE)     The eGFR has been calculated using the CKD EPI equation.     This calculation has not been validated in all clinical situations.     eGFR's persistently <90 mL/min signify possible Chronic Kidney     Disease.    Dg Chest 2 View  10/13/2013   CLINICAL DATA:  Chest pain and shortness of breath.  EXAM: CHEST  2 VIEW  COMPARISON:  Chest radiograph performed 10/10/2013  FINDINGS: The lungs are well-aerated. Vascular congestion is noted, with bilateral central airspace opacities and small bilateral pleural effusions, compatible with mildly worsened pulmonary edema. No pneumothorax is seen.  The heart is enlarged.  No acute osseous abnormalities are seen.  IMPRESSION: Vascular congestion and cardiomegaly, with bilateral central airspace opacities and small bilateral pleural effusions, compatible with mildly worsened pulmonary edema.   Electronically Signed   By: Roanna Raider M.D.   On: 10/13/2013 22:34    Review of Systems  Constitutional: Positive for malaise/fatigue.  Respiratory: Positive for shortness of breath.   Cardiovascular: Negative for chest pain.  Neurological: Negative for dizziness and loss of consciousness.  All other systems reviewed and are  negative.   Blood pressure 95/57, pulse 90, temperature 98 F (36.7 C), temperature source Oral, resp. rate 18, height 5\' 2"  (1.575 m), weight 130 lb (58.968 kg), SpO2 100.00%. Physical Exam  Constitutional: She is oriented to person, place, and time. No distress.  Frail appearing   Neck: JVD present. Carotid bruit is not present.  Cardiovascular: Normal rate and regular rhythm.   Murmur (3/6 best hear along LSB and apex) heard. Respiratory: Effort normal. No respiratory distress. She has no wheezes. She has rales (bibasilar).  Musculoskeletal: She exhibits edema (bilateral LEE).  Neurological: She is alert and oriented to person, place, and time.  Skin: Skin is warm and dry. She  is not diaphoretic.  Psychiatric: She has a normal mood and affect. Her behavior is normal.    Assessment/Plan: Active Problems:   Hypotension   CHF, acute on chronic   CHF exacerbation   Hyponatremia  Plan: Pt was re-admitted last PM for a/c diastolic HF. Agree with BID IV Lasix at 40 mg. Continue with strict I/Os. If she does not diurese well on 40 mg, she may require a higher dose. However, we will need to watch BP, as diuresis was limited last admission due to hypotension. ? Reducing BB dose to allow more room with BP for diuresis. Will also need to change diet to 2g sodium restriction. Check weights daily. Order daily BMPs to assess electrolytes. Replete K+ as needed. Hyponatremia should hopefully improve with diuresis. Continue to cycle troponins. MD to follow.    Allayne Butcher, PA-C 10/14/2013, 11:08 AM     Patient seen and examined. Agree with assessment and plan. Very pleasant but frail 79 yo WF who was re-admitted last evening after a prolonged hospitalization for CHF.  She has a documented flail posterior MV leaflet with severe MR, normal LV function with Grade II diastolic dysfunction, chronic hyponatremia prob contributed by significant CHF. She has not been felt to be a candidate for surgical therapy for her flail MV leaflet. Her BP has been low making aggressive diuresis difficult. ECG today with sinus rhythm at 85 with anterior T wave changes.  At present would dc propanolol, and change to carvedilol 3.125 bid. Agree with IV furosemide 40 mg every 12 hrs. Need to Na restrict and free water restrict since her hyponatremia is dilutional and she has excess Na and significant excess free water. Cr 1.28. BNP markedly elevated. Consider adding low dose aldosterone blockade with spironolactone at 12.5 mg daily. Will follow.   Lennette Bihari, MD, [facility] 10/14/2013 12:12 PM

## 2013-10-14 NOTE — Progress Notes (Signed)
Patient admitted yesterday from SNF.  Report from off-going nurse stated stage 1 to sacrum.  Foam dressing applied and pillow props with scheduled turning initiated.

## 2013-10-14 NOTE — H&P (Addendum)
PCP: [name], MD  Family requesting change in PCP   Chief Complaint:  Shortness of breath  HPI: Diane [name] is a 79 y.o. female   has a past medical history of Arthritis; GERD (gastroesophageal reflux disease); Glaucoma; Hyperlipidemia; Heart murmur; Migraine; CHF (congestive heart failure), NYHA class IV; Cystitis; Measles; Varicella without complication; Pneumonia (2004); UTI (urinary tract infection) (09/26/2013); and Dehydration (09/2013).   Presented with  Was just recently discharged from [facility] she had a recent UTI. Was discharged this afternoon. Went to SNF and started to have severe shortness of breath clutching at her chest. Family were concerned and requested her to be brought back to ER. She has hx of CHF due to Mitiral regurgitation. Have been followed by Dr. Allyson Sabal. Family normal weight is 113. She has been more confused for the past few hours. Family was told she is not a candidate for any operative intervention but has hx of severe valvular disease. Family is interested in diuresis and comfort care. States her blood pressure tend to run low. Family interested in Palliative care consult.    Review of Systems:    Pertinent positives include: chest pain, shortness of breath at rest.   Constitutional:  No weight loss, night sweats, Fevers, chills, fatigue, weight loss  HEENT:  No headaches, Difficulty swallowing,Tooth/dental problems,Sore throat,  No sneezing, itching, ear ache, nasal congestion, post nasal drip,  Cardio-vascular:  No  Orthopnea, PND, anasarca, dizziness, palpitations.no Bilateral lower extremity swelling  GI:  No heartburn, indigestion, abdominal pain, nausea, vomiting, diarrhea, change in bowel habits, loss of appetite, melena, blood in stool, hematemesis Resp:  noNo dyspnea on exertion, No excess mucus, no productive cough, No non-productive cough, No coughing up of blood.No change in color of mucus.No wheezing. Skin:  no rash or lesions. No  jaundice GU:  no dysuria, change in color of urine, no urgency or frequency. No straining to urinate.  No flank pain.  Musculoskeletal:  No joint pain or no joint swelling. No decreased range of motion. No back pain.  Psych:  No change in mood or affect. No depression or anxiety. No memory loss.  Neuro: no localizing neurological complaints, no tingling, no weakness, no double vision, no gait abnormality, no slurred speech, no confusion  Otherwise ROS are negative except for above, 10 systems were reviewed  Past Medical History: Past Medical History  Diagnosis Date  . Arthritis     worst:hands and shoulder  . GERD (gastroesophageal reflux disease)   . Glaucoma     s/p laser surgery  . Hyperlipidemia   . Heart murmur     MVR, AVS  . Migraine     resolved with menopause  . CHF (congestive heart failure), NYHA class IV     had been a hospice patient  . Cystitis     h/o frequent bouts  . Measles     childhood  . Varicella without complication     chilodhood  . Pneumonia 2004  . UTI (urinary tract infection) 09/26/2013  . Dehydration 09/2013   Past Surgical History  Procedure Laterality Date  . Breast biopsy    . Appendectomy      '41  . Tonsillectomy and adenoidectomy  '47  . Cholecystectomy  1980's  . Abdominal hysterectomy      1969  . G4p2    . Shoulder arthroscopy      right     Medications: Prior to Admission medications   Medication Sig Start Date End Date Taking?  Authorizing Provider  acetaminophen (TYLENOL) 325 MG tablet Take 2 tablets (650 mg total) by mouth every 6 (six) hours as needed for mild pain (or Fever >/= 101). 10/12/13  Yes Jacques Navy, MD  aspirin EC 81 MG tablet Take 81 mg by mouth daily.     Yes Historical Provider, MD  Cranberry 1000 MG CAPS Take 1,000 mg by mouth daily.    Yes Historical Provider, MD  docusate sodium (COLACE) 100 MG capsule Take 100 mg by mouth at bedtime.   Yes Historical Provider, MD  feeding supplement, ENSURE  COMPLETE, (ENSURE COMPLETE) LIQD Take 237 mLs by mouth 3 (three) times daily between meals. 10/12/13  Yes Jacques Navy, MD  furosemide (LASIX) 40 MG tablet Take 1 tablet (40 mg total) by mouth daily. 10/13/13  Yes Jacques Navy, MD  Loratadine (CLARITIN) 10 MG CAPS Take 10 mg by mouth daily.    Yes Historical Provider, MD  Multiple Vitamin (MULTIVITAMIN WITH MINERALS) TABS tablet Take 1 tablet by mouth daily.   Yes Historical Provider, MD  omeprazole (PRILOSEC) 40 MG capsule Take 40 mg by mouth daily.   Yes Historical Provider, MD  potassium chloride SA (K-DUR,KLOR-CON) 20 MEQ tablet Take 20 mEq by mouth every other day.   Yes Historical Provider, MD  Probiotic Product (PROBIOTIC & ACIDOPHILUS EX ST PO) Take 1 tablet by mouth daily.    Yes Historical Provider, MD  propranolol (INDERAL) 20 MG tablet Take 20 mg by mouth 2 (two) times daily.   Yes Historical Provider, MD  tamsulosin (FLOMAX) 0.4 MG CAPS capsule Take 1 capsule (0.4 mg total) by mouth daily. 10/12/13  Yes Jacques Navy, MD    Allergies:   Allergies  Allergen Reactions  . Alendronate Sodium   . Biapenem   . Evista [Raloxifene Hydrochloride]   . Lansoprazole   . Levofloxacin   . Lipitor [Atorvastatin Calcium]   . Lyrica [Pregabalin]   . Miacalcin   . Prednisone     Altered mental status  . Pyridium [Phenazopyridine Hcl]   . Statins Other (See Comments)    Cardiac reaction  . Sulfa Drugs Cross Reactors Nausea Only  . Welchol [Colesevelam Hcl]   . Zetia [Ezetimibe]     Cardiac reaction  . Penicillins Rash    Social History:  Ambulatory wheelchair bound Lives at SNF   reports that she has been passively smoking.  She has never used smokeless tobacco. She reports that she does not drink alcohol or use illicit drugs.   Family History: family history includes Alcohol abuse in her father; Heart disease in her sister and son.    Physical Exam: Patient Vitals for the past 24 hrs:  BP Temp Temp src Pulse Resp  SpO2 Height Weight  10/14/13 0000 103/60 mmHg - - 93 19 100 % - -  10/13/13 2345 110/63 mmHg - - 85 21 100 % - -  10/13/13 2330 105/68 mmHg - - 38 19 89 % - -  10/13/13 2328 99/62 mmHg - - 92 - 100 % - -  10/13/13 2319 99/62 mmHg - - 91 19 100 % - -  10/13/13 2245 102/66 mmHg - - 93 24 100 % - -  10/13/13 2230 106/62 mmHg - - 73 22 95 % - -  10/13/13 2145 107/66 mmHg - - 93 19 99 % - -  10/13/13 2130 - - - - - 100 % - -  10/13/13 2130 106/65 mmHg - - 93 25 100 % - -  10/13/13 2127 - - - - - 100 % 5\' 2"  (1.575 m) 58.968 kg (130 lb)  10/13/13 2126 98/59 mmHg 98.5 F (36.9 C) Oral 93 18 98 % - -  10/13/13 2121 - - - - - 100 % - -    1. General:  in No Acute distress 2. Psychological: Alert but not Oriented 3. Head/ENT:   Moist   Mucous Membranes                          Head Non traumatic, neck supple                          poor  Dentition 4. SKIN: normal   Skin turgor,  Skin clean Dry and intact no rash 5. Heart: Regular rate and rhythm loud systolic Murmur noted, Rub or gallop 6. Lungs:  wheezes occasional crackles   7. Abdomen: Soft, non-tender, Non distended 8. Lower extremities: no clubbing, cyanosis, 1+ edema 9. Neurologically Grossly intact, moving all 4 extremities equally 10. MSK: Normal range of motion  body mass index is 23.77 kg/(m^2).   Labs on Admission:   Recent Labs  10/11/13 0639 10/13/13 2205  NA 125* 124*  K 4.5 4.8  CL 88* 86*  CO2 23 29  GLUCOSE 101* 122*  BUN 37* 42*  CREATININE 1.24* 1.28*  CALCIUM 8.3* 8.2*  PHOS 3.4  --     Recent Labs  10/11/13 0639  ALBUMIN 2.7*   No results found for this basename: LIPASE, AMYLASE,  in the last 72 hours  Recent Labs  10/13/13 2205  WBC 9.5  NEUTROABS 6.7  HGB 11.8*  HCT 35.5*  MCV 89.9  PLT 275   No results found for this basename: CKTOTAL, CKMB, CKMBINDEX, TROPONINI,  in the last 72 hours No results found for this basename: TSH, T4TOTAL, FREET3, T3FREE, THYROIDAB,  in the last 72  hours No results found for this basename: VITAMINB12, FOLATE, FERRITIN, TIBC, IRON, RETICCTPCT,  in the last 72 hours No results found for this basename: HGBA1C    Estimated Creatinine Clearance: 24.5 ml/min (by C-G formula based on Cr of 1.28). ABG    Component Value Date/Time   PHART 7.472* 10/08/2013 1700   HCO3 27.7* 10/08/2013 1700   TCO2 28.8 10/08/2013 1700   ACIDBASEDEF 1.4 10/30/2010 2235   O2SAT 80.4 10/08/2013 1700     No results found for this basename: DDIMER     Other results:  I have pearsonaly reviewed this: ECG REPORT  Rate: 93  Rhythm: Right axis deviation ST&T Change: t wave inversion V1-v3  UA  3-6 WBC few yeast BNP 20260  Cultures:    Component Value Date/Time   SDES URINE, CATHETERIZED 09/27/2013 1310   SPECREQUEST NONE 09/27/2013 1310   CULT  Value: ESCHERICHIA COLI Performed at [facility] 09/27/2013 1310   REPTSTATUS 09/29/2013 FINAL 09/27/2013 1310       Radiological Exams on Admission: Dg Chest 2 View  10/13/2013   CLINICAL DATA:  Chest pain and shortness of breath.  EXAM: CHEST  2 VIEW  COMPARISON:  Chest radiograph performed 10/10/2013  FINDINGS: The lungs are well-aerated. Vascular congestion is noted, with bilateral central airspace opacities and small bilateral pleural effusions, compatible with mildly worsened pulmonary edema. No pneumothorax is seen.  The heart is enlarged.  No acute osseous abnormalities are seen.  IMPRESSION: Vascular congestion and cardiomegaly, with bilateral central airspace opacities  and small bilateral pleural effusions, compatible with mildly worsened pulmonary edema.   Electronically Signed   By: Roanna Raider M.D.   On: 10/13/2013 22:34    Chart has been reviewed  Assessment/Plan  79 year old female history of dementia and extensive cardiac history with aortic stenosis and mitral valve regurgitation. Presents with CHF exacerbation after been recently discharged. Present on Admission:  .  Hypotension - per family patient has a history of somewhat low blood pressures we'll continue to monitor her they did not wish any aggressive measures at this point.  . CHF exacerbation - patient was recently needed admitted for altered mental status and UTI likely receive some IV fluids. Currently appears to be somewhat fluid overload with peripheral edema and pulmonary edema on chest x-ray. Family did not wish aggressive measures but do wish to have her diuresed. Will admit to telemetry cycle cardiac markers as well as give Lasix IV. This could be adjusted father as needed. Family is interested in palliative care consult which could be called in a.m.  Marland Kitchen Hyponatremia - the patient has chronic history of hyponatremia likely secondary to CHF. Will fluid restrict obtain urine electrolytes Symptoms mentioned patient is actively sundowning - provide supportive care and sedation as needed.   Prophylaxis:   Lovenox, Protonix  CODE STATUS: DNR/ DNI per family wishes  Other plan as per orders.  I have spent a total of 55 min on this admission  Laurice Kimmons 10/14/2013, 12:11 AM

## 2013-10-14 NOTE — Plan of Care (Signed)
Problem: Phase I Progression Outcomes Goal: Initial discharge plan identified Outcome: Completed/Met Date Met:  10/14/13 From [facility]

## 2013-10-15 LAB — BASIC METABOLIC PANEL
BUN: 37 mg/dL — ABNORMAL HIGH (ref 6–23)
Calcium: 8 mg/dL — ABNORMAL LOW (ref 8.4–10.5)
Creatinine, Ser: 1.23 mg/dL — ABNORMAL HIGH (ref 0.50–1.10)
GFR calc Af Amer: 44 mL/min — ABNORMAL LOW (ref >90)
GFR calc non Af Amer: 38 mL/min — ABNORMAL LOW (ref >90)
Glucose, Bld: 104 mg/dL — ABNORMAL HIGH (ref 70–99)
Potassium: 4.5 mEq/L (ref 3.5–5.1)
Sodium: 124 mEq/L — ABNORMAL LOW (ref 135–145)

## 2013-10-15 MED ORDER — MORPHINE SULFATE 10 MG/5ML PO SOLN
5.0000 mg | ORAL | Status: DC | PRN
  Administered 2013-10-16: 5 mg via ORAL
  Filled 2013-10-15: qty 5

## 2013-10-15 MED ORDER — MORPHINE SULFATE 10 MG/5ML PO SOLN: 5.0000 mg | ORAL | Status: DC | PRN

## 2013-10-15 MED ORDER — MORPHINE SULFATE 2 MG/ML IJ SOLN
2.0000 mg | INTRAMUSCULAR | Status: DC | PRN
  Filled 2013-10-15: qty 1

## 2013-10-15 MED ORDER — SPIRONOLACTONE 12.5 MG HALF TABLET
12.5000 mg | ORAL_TABLET | Freq: Two times a day (BID) | ORAL | Status: DC
  Filled 2013-10-15 (×2): qty 1

## 2013-10-15 MED ORDER — MORPHINE SULFATE 2 MG/ML IJ SOLN: 2.0000 mg | INTRAMUSCULAR | Status: DC | PRN

## 2013-10-15 NOTE — Progress Notes (Signed)
Subjective: Diane [name] is awake and she reports that she is feeling better. She did have one episode while we were in conference of Dyspnea resolved by raising the bed.   Objective: Lab:  Recent Labs  10/13/13 2205 10/14/13 0503  WBC 9.5 7.8  NEUTROABS 6.7 5.6  HGB 11.8* 11.9*  HCT 35.5* 35.8*  MCV 89.9 90.4  PLT 275 260    Recent Labs  10/13/13 2205 10/14/13 0503 10/15/13 0440  NA 124*  --  124*  K 4.8  --  4.5  CL 86*  --  86*  GLUCOSE 122*  --  104*  BUN 42*  --  37*  CREATININE 1.28* 1.27* 1.23*  CALCIUM 8.2*  --  8.0*  MG  --  2.7*  --    BNP 22,000  Imaging:  Scheduled Meds: . aspirin EC  81 mg Oral Daily  . carvedilol  3.125 mg Oral BID WC  . docusate sodium  100 mg Oral QHS  . enoxaparin (LOVENOX) injection  30 mg Subcutaneous Q24H  . furosemide  40 mg Intravenous Q12H  . loratadine  10 mg Oral Daily  . pantoprazole  40 mg Oral Daily  . polyethylene glycol  17 g Oral Daily  . sodium chloride  3 mL Intravenous Q12H  . spironolactone  12.5 mg Oral BID  . tamsulosin  0.4 mg Oral Daily   Continuous Infusions:  PRN Meds:.sodium chloride, acetaminophen, LORazepam, ondansetron (ZOFRAN) IV, sodium chloride   Physical Exam: Filed Vitals:   10/15/13 0549  BP: 94/63  Pulse: 80  Temp: 97.7 F (36.5 C)  Resp: 20    Intake/Output Summary (Last 24 hours) at 10/15/13 1053 Last data filed at 10/15/13 0900  Gross per 24 hour  Intake    720 ml  Output    875 ml  Net   -155 ml   Total I/) this adm: -115  Gen'l - pleasant older woman in no distress HEENT- C&S clear Cor Reg  Pulm - no increased WOB, decreased BS at the bases, feint rales at the bases Abd - soft Neuro - HOH, non-focal       Assessment/Plan: 1. Cardiac - small diuresis with IV lasix plus aldactone. No significant respiratory distress  Plan confort care - see below  2. Renal - low sodium, chronic, stable renal insufficiency  3. GU - no active problem  5. End of life care -  met with patient's son, daughter in law, daughter and family friend. Reviewed patients status and prognosis in a setting of end stage heart disease. By family report the patient has in the past expressed her desire for comfort care when at the end of life, facing intractable disease, in this case heart failure. Discussed options for care. Decision is for comfort care - withdrawal of all active treatments and place in residential hospice. The organization of choice is [facility]. Orders initiated.    [name] [place] [facility] (o) [phone]; (c) [phone] Call-grp - [name]  Tele: [phone]  10/15/2013, 10:52 AM

## 2013-10-15 NOTE — Progress Notes (Signed)
Subjective: No complaints. States that she feels better. She is w/o supplemental O2.   Objective: Vital signs in last 24 hours: Temp:  [97.7 F (36.5 C)-98 F (36.7 C)] 97.7 F (36.5 C) (12/07 0549) Pulse Rate:  [78-90] 80 (12/07 0549) Resp:  [18-20] 20 (12/07 0549) BP: (89-98)/(57-63) 94/63 mmHg (12/07 0549) SpO2:  [100 %] 100 % (12/07 0549) Weight:  [151 lb 0.2 oz (68.5 kg)] 151 lb 0.2 oz (68.5 kg) (12/07 0549) Last BM Date: 10/12/13  Intake/Output from previous day: 12/06 0701 - 12/07 0700 In: 720 [P.O.:720] Out: 1075 [Urine:1075] Intake/Output this shift:    Medications Current Facility-Administered Medications  Medication Dose Route Frequency Provider Last Rate Last Dose  . 0.9 %  sodium chloride infusion  250 mL Intravenous PRN Therisa Doyne, MD      . acetaminophen (TYLENOL) tablet 650 mg  650 mg Oral Q4H PRN Therisa Doyne, MD      . aspirin EC tablet 81 mg  81 mg Oral Daily Therisa Doyne, MD   81 mg at 10/14/13 1049  . carvedilol (COREG) tablet 3.125 mg  3.125 mg Oral BID WC Brittainy Simmons, PA-C   3.125 mg at 10/15/13 0606  . docusate sodium (COLACE) capsule 100 mg  100 mg Oral QHS Therisa Doyne, MD   100 mg at 10/14/13 2147  . enoxaparin (LOVENOX) injection 30 mg  30 mg Subcutaneous Q24H Therisa Doyne, MD   30 mg at 10/14/13 1426  . furosemide (LASIX) injection 40 mg  40 mg Intravenous Q12H Jacques Navy, MD   40 mg at 10/14/13 2147  . loratadine (CLARITIN) tablet 10 mg  10 mg Oral Daily Therisa Doyne, MD   10 mg at 10/14/13 1049  . LORazepam (ATIVAN) injection 0.5 mg  0.5 mg Intravenous Q6H PRN Therisa Doyne, MD      . ondansetron (ZOFRAN) injection 4 mg  4 mg Intravenous Q6H PRN Therisa Doyne, MD      . pantoprazole (PROTONIX) EC tablet 40 mg  40 mg Oral Daily Therisa Doyne, MD   40 mg at 10/14/13 1049  . polyethylene glycol (MIRALAX / GLYCOLAX) packet 17 g  17 g Oral Daily Jacques Navy, MD   17 g at 10/14/13  1156  . sodium chloride 0.9 % injection 3 mL  3 mL Intravenous Q12H Therisa Doyne, MD   3 mL at 10/14/13 2148  . sodium chloride 0.9 % injection 3 mL  3 mL Intravenous PRN Therisa Doyne, MD      . spironolactone (ALDACTONE) tablet 12.5 mg  12.5 mg Oral Daily Brittainy Simmons, PA-C   12.5 mg at 10/14/13 1314  . tamsulosin (FLOMAX) capsule 0.4 mg  0.4 mg Oral Daily Jacques Navy, MD   0.4 mg at 10/14/13 1156    PE: General appearance: alert, cooperative, no distress and frail appearing Lungs: babasilar crackles Heart: regular rate and rhythm Extremities: 1+ bilateral LEE Pulses: 2+ and symmetric Skin: warm and dry Neurologic: Grossly normal  Lab Results:   Recent Labs  10/13/13 2205 10/14/13 0503  WBC 9.5 7.8  HGB 11.8* 11.9*  HCT 35.5* 35.8*  PLT 275 260   BMET  Recent Labs  10/13/13 2205 10/14/13 0503 10/15/13 0440  NA 124*  --  124*  K 4.8  --  4.5  CL 86*  --  86*  CO2 29  --  26  GLUCOSE 122*  --  104*  BUN 42*  --  37*  CREATININE 1.28* 1.27*  1.23*  CALCIUM 8.2*  --  8.0*   Cardiac Panel (last 3 results)  Recent Labs  10/14/13 0503 10/14/13 1125 10/14/13 1615  TROPONINI <0.30 <0.30 <0.30   BNP (last 3 results)  Recent Labs  10/11/13 0913 10/13/13 2205 10/15/13 0440  PROBNP 25517.0* 20260.0* 22606.0*    Assessment/Plan  Active Problems:   Hypotension   CHF, acute on chronic   CHF exacerbation   Hyponatremia  Plan: Pt only diuresed 1L yesterday and BNP has actually increased from 20K to 22K. She continues to have bibasilar rales and LEE on exam. Continue BID IV Lasix at 40 mg. Unfortunately, there is little room to increase dose, due to SBP which remains in the low 90s. Continue spironolactone, low sodium diet and reduced free water intake. MD to follow with further recommendations.    LOS: 2 days    Brittainy M. Delmer Islam 10/15/2013 9:10 AM   Patient seen and examined. Agree with assessment and plan. I/O yesterday -  355. CXR yesterday with worsening pulmonary edema. She now is on lasix 40 iv every 12 hrs and received 1 dose of spironolactone (currently not yet steady state) She has chronic SBP in 90's. Will try to give spironolactone bid today if tolerates. Dr. Arthur Holms is currently having palliative care discussions with family members. I will discuss with Dr. Allyson Sabal her primary cardiologist in am.  I have not started inotropic therapy with discussions concerning [facility].   Lennette Bihari, MD, [facility] 10/15/2013 10:27 AM

## 2013-10-16 LAB — URINE CULTURE: Colony Count: >=100000

## 2013-10-16 MED ORDER — MORPHINE SULFATE 10 MG/5ML PO SOLN: 5.0000 mg | ORAL | Status: AC | PRN

## 2013-10-16 NOTE — Progress Notes (Signed)
Subjective: Diane [name] says she feels rotten. She does seem comfortable.   Objective: Lab:  Recent Labs  10/13/13 2205 10/14/13 0503  WBC 9.5 7.8  NEUTROABS 6.7 5.6  HGB 11.8* 11.9*  HCT 35.5* 35.8*  MCV 89.9 90.4  PLT 275 260    Recent Labs  10/13/13 2205 10/14/13 0503 10/15/13 0440  NA 124*  --  124*  K 4.8  --  4.5  CL 86*  --  86*  GLUCOSE 122*  --  104*  BUN 42*  --  37*  CREATININE 1.28* 1.27* 1.23*  CALCIUM 8.2*  --  8.0*  MG  --  2.7*  --     Imaging:  Scheduled Meds: . docusate sodium  100 mg Oral QHS  . loratadine  10 mg Oral Daily  . pantoprazole  40 mg Oral Daily  . polyethylene glycol  17 g Oral Daily  . sodium chloride  3 mL Intravenous Q12H  . tamsulosin  0.4 mg Oral Daily   Continuous Infusions:  PRN Meds:.sodium chloride, acetaminophen, LORazepam, morphine, morphine injection, ondansetron (ZOFRAN) IV, sodium chloride   Physical Exam: Filed Vitals:   10/16/13 0610  BP: 99/53  Pulse: 109  Temp: 97.3 F (36.3 C)  Resp: 18   gen'l - Elderly woman in no distress HEENT - C&S clear Cor - II/VI mm. Pulm - no increased WOB, decreased BS, no frank rales Neuro - awake     Assessment/Plan: 1. Comfort care - per detailed not from 12/7 - family conference - she is for comfort care only.  Plan [facility] placement.    [name] [place] [facility] (o) [phone]; (c) [phone] Call-grp - [name] IM  Tele: [phone]  10/16/2013, 7:06 AM

## 2013-10-16 NOTE — Progress Notes (Signed)
         Subjective: "I am just uncomfortable"  Objective: Vital signs in last 24 hours: Temp:  [97.3 F (36.3 C)-97.5 F (36.4 C)] 97.3 F (36.3 C) (12/08 0610) Pulse Rate:  [86-109] 109 (12/08 0610) Resp:  [18-20] 18 (12/08 0610) BP: (99-101)/(53-62) 99/53 mmHg (12/08 0610) SpO2:  [93 %-100 %] 93 % (12/08 0610) Weight:  [135 lb 12.9 oz (61.6 kg)] 135 lb 12.9 oz (61.6 kg) (12/08 0610) Weight change: -15 lb 3.4 oz (-6.9 kg) Last BM Date: 10/14/13 Intake/Output from previous day:+283 (-72 since admit) wt 135, up from 130 on admit but scales are giving ambiguous data.  12/07 0701 - 12/08 0700 In: 283 [P.O.:280; I.V.:3] Out: -  Intake/Output this shift:    PE: General:Pleasant affect, NAD, Hard of Hearing Skin:Warm and dry, brisk capillary refill HEENT:normocephalic, sclera clear, mucus membranes moist Heart:S1S2 RRR  Lungs:clear, ant without rales, rhonchi, or wheezes ZOX:WRUE, non tender, + BS, do not palpate liver spleen or masses Ext:+ lower ext edema bil.,  Neuro:alert and oriented, MAE, follows commands, + facial symmetry   Lab Results:  Recent Labs  10/13/13 2205 10/14/13 0503  WBC 9.5 7.8  HGB 11.8* 11.9*  HCT 35.5* 35.8*  PLT 275 260   BMET  Recent Labs  10/13/13 2205 10/14/13 0503 10/15/13 0440  NA 124*  --  124*  K 4.8  --  4.5  CL 86*  --  86*  CO2 29  --  26  GLUCOSE 122*  --  104*  BUN 42*  --  37*  CREATININE 1.28* 1.27* 1.23*  CALCIUM 8.2*  --  8.0*    Recent Labs  10/14/13 1125 10/14/13 1615  TROPONINI <0.30 <0.30    Lab Results  Component Value Date   CHOL 220* 07/06/2012   HDL 68.40 07/06/2012   LDLDIRECT 135.6 07/06/2012   TRIG 107.0 07/06/2012   CHOLHDL 3 07/06/2012   No results found for this basename: HGBA1C     Lab Results  Component Value Date   TSH 2.72 01/05/2013       Studies/Results: No results found.  Medications: I have reviewed the patient's current medications. Scheduled Meds: . docusate sodium   100 mg Oral QHS  . loratadine  10 mg Oral Daily  . pantoprazole  40 mg Oral Daily  . polyethylene glycol  17 g Oral Daily  . sodium chloride  3 mL Intravenous Q12H  . tamsulosin  0.4 mg Oral Daily   Continuous Infusions:  PRN Meds:.sodium chloride, acetaminophen, LORazepam, morphine, morphine injection, ondansetron (ZOFRAN) IV, sodium chloride  Assessment/Plan: Active Problems:   Hypotension   CHF, acute on chronic   CHF exacerbation   Hyponatremia  PLAN: BP 99/53, Per Dr. Arthur Holms pt is comfort care only.  Plan for [facility]. No longer on tele, lasix.  Withdrawal of active treatment.  We will sign off please call for questions.  LOS: 3 days   Time spent with pt. :15 minutes. [facility] R  Nurse Practitioner Certified Pager [phone] or after 5pm and on weekends call [phone] 10/16/2013, 9:02 AM   Patient seen and examined. Agree with assessment and plan. Plan is for palliative care per outlined by Dr Debby Bud after long family discussion yesterday. Will sign off but available if need arises.   Lennette Bihari, MD, [facility] 10/16/2013 10:15 AM

## 2013-10-16 NOTE — Social Work (Signed)
Referred to CSW today for residential hospice placement. Patient was not arousal so spoke with family- Toyna Erisman [phone] to discuss this option-Patient had been living alone and was just last week placed in SNF care at [facility]- family was not pleased with the facility so returned to the hospital where she was admitted and now has began transitioning to comfort care-  Alanys states patient was active with Hospice about 3 years ago and was discharged- they would be open to residential hospice and prefer [facility]. Forrestine Him, [facility] Liason aware and to f/u with family- bed is anticipated to be available and I have notified Dr. Debby Bud for d/c summary to be completed for transfer.  CSW will f/u once all is confirmed and finalized. Reece Levy, MSW, LCSWA [phone]

## 2013-10-16 NOTE — Consult Note (Signed)
[facility] Liaison: [employer] available for Ms. Vicknair today. Spoke with CSW Marylu Lund prior to speaking with DIL Bonita Quin and [employer] by phone to confirm. Plan to meet both at noon to complete registration paperwork. Will need DC summary faxed to [phone] and RN to call report top [phone]. Thank you. Forrestine Him LCSW [phone]

## 2013-10-16 NOTE — Social Work (Signed)
Clinical Social Work Department BRIEF PSYCHOSOCIAL ASSESSMENT 10/16/2013  Patient:  Diane [name], Diane [name]     Account Number:  [id number]     Admit date:  09/26/2009  Clinical Social Worker:  Robin Searing  Date/Time:  10/16/2013 11:52 AM  Referred by:  Physician  Date Referred:  10/16/2013 Referred for  Residential hospice placement   Other Referral:   Interview type:  Family Other interview type:   Spoke with family- Alethia Berthold    PSYCHOSOCIAL DATA Living Status:  FACILITY Admitted from facility:  [facility] Level of care:  Skilled Nursing Facility Primary support name:  son, daughter and daughter in law Primary support relationship to patient:  FAMILY Degree of support available:   good    CURRENT CONCERNS Current Concerns  Post-Acute Placement   Other Concerns:    SOCIAL WORK ASSESSMENT / PLAN Patient admitted from [facility]- per family she was admitted there on Friday and they were not pleased and her returned to the hospital- patient was admitted and now is for comfort care and residential hospice placement- I have made referral to [facility] and Forrestine Him plans to meet with family at noon today-   Assessment/plan status:  Psychosocial Support/Ongoing Assessment of Needs Other assessment/ plan:   Will await final plans but likely transfer today to [facility]-   Information/referral to community resources:   Psychologist, sport and exercise  other Hospice homes    PATIENT'S/FAMILY'S RESPONSE TO PLAN OF CARE: Family appreciative and hopeful for bed at [facility]-        Reece Levy, MSW, LCSWA [phone]

## 2013-10-16 NOTE — Discharge Summary (Signed)
Diane [name], Diane [name]                ACCOUNT NO.:  [id number]  MEDICAL RECORD NO.:  [id number]  LOCATION:  4E15C                        FACILITY:  [facility]  PHYSICIAN:  Rosalyn Gess. Lamyiah Crawshaw, MD  DATE OF BIRTH:  1924/06/25  DATE OF ADMISSION:  10/13/2013 DATE OF DISCHARGE:  10/16/2013                              DISCHARGE SUMMARY   ADMITTED DIAGNOSES: 1. Congestive heart failure, class IV, diastolic. 2. Hypotension. 3. Hyperkalemia. 4. Chronic hyponatremia. 5. Failure to thrive.  DISCHARGE DIAGNOSES: 1. Congestive heart failure, class IV, diastolic. 2. Hypotension. 3. Hyperkalemia. 4. Chronic hyponatremia. 5. Failure to thrive.  CONSULTANTS:  Dr. Daphene Jaeger for Cardiology.  PROCEDURES:  Chest x-ray, October 13, 2013, revealing vascular congestion and cardiomegaly with bilateral central airspace opacities and small bilateral pleural effusions, compatible with mildly worsened pulmonary edema.  HISTORY OF PRESENT ILLNESS:  Mrs. Caris is an 79 year old woman with a known history of flail mitral valve leaflet, and grade 2 diastolic dysfunction, with chronic diastolic heart failure.  The patient was recently hospitalized for 16 days where she was admitted for UTI and community-acquired pneumonia.  During that hospitalization, she responded well to antibiotics.  During that hospitalization, she also had chronic hyponatremia as well as persistent fluid overload with congestive heart failure.  She did reach a point of maximal hospital benefit despite having an elevated BNP at 25,517 at time of discharge, but she had good oxygen saturations and was not in respiratory distress. The patient was transferred to a skilled care facility.  At the skilled care facility, after several hours, she did develop increased dyspnea and was returned to hospital where she was readmitted for active congestive heart failure.  Her BNP had improved to 20,000, but she had mildly worsening on chest x-ray.  The  patient also had a bad time at the skilled care facility.  Please see the admission note for past medical history, family history, and social history.  HOSPITAL COURSE:  The patient was admitted to the telemetry floor initially.  Cardiac enzymes were cycled and were unremarkable.  BNP was 20,000 at time of admission and did rise to 22,000 after continued inpatient care.  The patient was seen in consultation by Dr. Daphene Jaeger for [facility] and Vascular, in addition to q.12 IV Lasix, they added low dose of Aldactone to the patient's regimen.  She did have a very minimal response to diuresis.  The patient has had a deteriorating course over the past several months. She was independent in her living situation but at the time of her last discharge was not able to live on her own and did have to go to skilled care.  The patient's family has been very much involved in her care.  A family conference was held in regard to goals of care.  The family was unanimous in stating that both Mrs. Fitzner and her husband had discussed many times their wishes in regard to end-of-life care, and that they both had wanted supportive care only.  The patient's son, daughter and daughter-in-law at the family conference did agree that the best course of treatment was full comfort care only.  After much discussion on the details of treatment,  it was decided to withdraw aggressive treatment for congestive heart failure and focus on comfort care only.  They understand that by stopping active medical therapy that the patient's prognosis was terminal with a short-term horizon.  The family was comfortable with this decision and they felt that it was in accord with Mrs. Eplin's wishes.  Subsequently, patient's IV Lasix was discontinued.  All medications were stopped, that were not contributing to her comfort.  A standing order was established for morphine to control dyspnea, shortness of breath and discomfort.     At this point, the patient has been able to secure a bed at [facility] and is ready for transfer for end-of-life care.  DISCHARGE EXAMINATION:  VITAL SIGNS:  Temperature was 97.3, blood pressure 99/53, heart rate was 109, respirations 18, oxygen saturation on 2 L was 93%. GENERAL APPEARANCE:  This is a pleasant elderly woman who is awake, but does complain of feeling "rotten."  She has no respiratory distress at the time of her exam on the morning the day of discharge. HEENT:  Conjunctiva and sclerae was clear.  Oropharynx without lesions. NECK:  Supple.  She did have some mild JVD. CARDIOVASCULAR:  2+ radial pulse.  Her heart rate was regular.  She has a very loud 3/6 systolic murmur heard best at the apex with a 2/6 diastolic murmur. PULMONARY:  The patient's respirations are somewhat shallow.  She has no active increased work of breathing.  She has faint bibasilar rales. ABDOMEN:  Soft.  No guarding or rebound was appreciated. GENITALIA:  Deferred. EXTREMITIES:  Without deformity, but she does have 1 to 2+ edema at the distal lower extremities. NEURO:  Patient is awake.  She recognizes this examiner.  She is oriented to her place.  FINAL LABORATORY:  From October 15, 2013; sodium was 124, potassium 4.5, chloride 86, CO2 of 26, BUN 37, creatinine 1.23, glucose was 104. Troponin was negative x3.  Final BNP was 22,606.  CBC from December 6, revealed hemoglobin of 11.9 g, white count was 7.8, platelet count was normal at 260,000, differential was unremarkable.  DISCHARGE MEDICATIONS: 1. Tylenol 650 mg p.o. p.r.n. fever or discomfort. 2. Colace 100 mg at bedtime. 3. Ensure supplement 3 times a day. 4. Ativan 0.5 mg p.o. q.6 p.r.n. agitation or discomfort. 5. Morphine using Roxanol 10 mg per 5 mL and giving 5 mg q.2 h. p.r.n.     dyspnea or shortness of breath. 6. Omeprazole 40 mg q.a.m. for comfort. 7. Tamsulosin 0.4 mg daily to prevent urinary retention.  DISPOSITION:   The patient to be transferred to [facility] for comfort care.  All facility protocols may be followed.  Attending will be Dr. Debby Bud, with co-management by hospice physicians.  The patient is a DNR and no return to hospital.  The patient's condition at time of discharge dictation is premorbid.     Rosalyn Gess Renaye Janicki, MD     MEN/MEDQ  D:  10/16/2013  T:  10/16/2013  Job:  [id number]

## 2013-10-17 ENCOUNTER — Telehealth: Payer: Self-pay

## 2013-10-17 NOTE — Telephone Encounter (Signed)
Ok to dc tamsulosin

## 2013-10-17 NOTE — Telephone Encounter (Signed)
Phone call from Kathleen Lime, nurse at [facility] [phone] states a foley catheter was placed this morning for urinary retention and flomax needs to be discontinued. Please advise.

## 2013-10-17 NOTE — Telephone Encounter (Signed)
Diane [name] has been notified.

## 2013-11-06 ENCOUNTER — Ambulatory Visit: Payer: Medicare Other | Admitting: Internal Medicine

## 2013-11-09 DEATH — deceased

## 2013-11-27 ENCOUNTER — Telehealth: Payer: Self-pay

## 2013-11-27 NOTE — Telephone Encounter (Signed)
[facility] per [facility] [employer]
